# Patient Record
Sex: Female | Born: 1940
Health system: Southern US, Community
[De-identification: ages and names within clinical notes are randomized; demographics above are authoritative.]

## PROBLEM LIST (undated history)

## (undated) DIAGNOSIS — S065X9A Traumatic subdural hemorrhage with loss of consciousness of unspecified duration, initial encounter: Secondary | ICD-10-CM

## (undated) DIAGNOSIS — F410 Panic disorder [episodic paroxysmal anxiety] without agoraphobia: Secondary | ICD-10-CM

## (undated) DIAGNOSIS — J45909 Unspecified asthma, uncomplicated: Secondary | ICD-10-CM

## (undated) DIAGNOSIS — R112 Nausea with vomiting, unspecified: Secondary | ICD-10-CM

## (undated) DIAGNOSIS — K219 Gastro-esophageal reflux disease without esophagitis: Secondary | ICD-10-CM

## (undated) DIAGNOSIS — I809 Phlebitis and thrombophlebitis of unspecified site: Secondary | ICD-10-CM

## (undated) DIAGNOSIS — D649 Anemia, unspecified: Secondary | ICD-10-CM

## (undated) DIAGNOSIS — K649 Unspecified hemorrhoids: Secondary | ICD-10-CM

## (undated) DIAGNOSIS — N816 Rectocele: Secondary | ICD-10-CM

## (undated) DIAGNOSIS — K589 Irritable bowel syndrome without diarrhea: Secondary | ICD-10-CM

## (undated) DIAGNOSIS — Z9889 Other specified postprocedural states: Secondary | ICD-10-CM

## (undated) DIAGNOSIS — T4145XA Adverse effect of unspecified anesthetic, initial encounter: Secondary | ICD-10-CM

## (undated) DIAGNOSIS — T8859XA Other complications of anesthesia, initial encounter: Secondary | ICD-10-CM

## (undated) DIAGNOSIS — R51 Headache: Secondary | ICD-10-CM

## (undated) DIAGNOSIS — J189 Pneumonia, unspecified organism: Secondary | ICD-10-CM

## (undated) DIAGNOSIS — IMO0002 Reserved for concepts with insufficient information to code with codable children: Secondary | ICD-10-CM

## (undated) HISTORY — PX: SHOULDER SURGERY: SHX246

## (undated) HISTORY — PX: TONSILLECTOMY: SUR1361

## (undated) HISTORY — PX: CATARACT EXTRACTION: SUR2

## (undated) HISTORY — PX: ABDOMINAL HYSTERECTOMY: SHX81

---

## 1996-03-23 DIAGNOSIS — S065X9A Traumatic subdural hemorrhage with loss of consciousness of unspecified duration, initial encounter: Secondary | ICD-10-CM

## 1996-03-23 DIAGNOSIS — S065XAA Traumatic subdural hemorrhage with loss of consciousness status unknown, initial encounter: Secondary | ICD-10-CM

## 1996-03-23 HISTORY — DX: Traumatic subdural hemorrhage with loss of consciousness status unknown, initial encounter: S06.5XAA

## 1996-03-23 HISTORY — DX: Traumatic subdural hemorrhage with loss of consciousness of unspecified duration, initial encounter: S06.5X9A

## 2001-04-22 ENCOUNTER — Ambulatory Visit (HOSPITAL_COMMUNITY): Admission: RE | Admit: 2001-04-22 | Discharge: 2001-04-22 | Payer: Self-pay | Admitting: *Deleted

## 2001-11-23 ENCOUNTER — Other Ambulatory Visit: Admission: RE | Admit: 2001-11-23 | Discharge: 2001-11-23 | Payer: Self-pay | Admitting: Obstetrics & Gynecology

## 2003-04-11 ENCOUNTER — Other Ambulatory Visit: Admission: RE | Admit: 2003-04-11 | Discharge: 2003-04-11 | Payer: Self-pay | Admitting: Obstetrics & Gynecology

## 2004-04-23 ENCOUNTER — Other Ambulatory Visit: Admission: RE | Admit: 2004-04-23 | Discharge: 2004-04-23 | Payer: Self-pay | Admitting: Obstetrics & Gynecology

## 2005-05-19 ENCOUNTER — Other Ambulatory Visit: Admission: RE | Admit: 2005-05-19 | Discharge: 2005-05-19 | Payer: Self-pay | Admitting: Obstetrics & Gynecology

## 2008-09-09 ENCOUNTER — Emergency Department (HOSPITAL_BASED_OUTPATIENT_CLINIC_OR_DEPARTMENT_OTHER): Admission: EM | Admit: 2008-09-09 | Discharge: 2008-09-09 | Payer: Self-pay | Admitting: Emergency Medicine

## 2011-03-27 DIAGNOSIS — L820 Inflamed seborrheic keratosis: Secondary | ICD-10-CM | POA: Diagnosis not present

## 2011-03-27 DIAGNOSIS — L719 Rosacea, unspecified: Secondary | ICD-10-CM | POA: Diagnosis not present

## 2011-04-06 DIAGNOSIS — M999 Biomechanical lesion, unspecified: Secondary | ICD-10-CM | POA: Diagnosis not present

## 2011-04-06 DIAGNOSIS — M545 Low back pain: Secondary | ICD-10-CM | POA: Diagnosis not present

## 2011-04-08 DIAGNOSIS — Z0000 Encounter for general adult medical examination without abnormal findings: Secondary | ICD-10-CM | POA: Diagnosis not present

## 2011-04-08 DIAGNOSIS — G479 Sleep disorder, unspecified: Secondary | ICD-10-CM | POA: Diagnosis not present

## 2011-04-08 DIAGNOSIS — R079 Chest pain, unspecified: Secondary | ICD-10-CM | POA: Diagnosis not present

## 2011-04-08 DIAGNOSIS — Z8349 Family history of other endocrine, nutritional and metabolic diseases: Secondary | ICD-10-CM | POA: Diagnosis not present

## 2011-04-13 DIAGNOSIS — H02059 Trichiasis without entropian unspecified eye, unspecified eyelid: Secondary | ICD-10-CM | POA: Diagnosis not present

## 2011-04-13 DIAGNOSIS — H2510 Age-related nuclear cataract, unspecified eye: Secondary | ICD-10-CM | POA: Diagnosis not present

## 2011-04-13 DIAGNOSIS — H02409 Unspecified ptosis of unspecified eyelid: Secondary | ICD-10-CM | POA: Diagnosis not present

## 2011-04-21 DIAGNOSIS — G479 Sleep disorder, unspecified: Secondary | ICD-10-CM | POA: Diagnosis not present

## 2011-04-21 DIAGNOSIS — Z8349 Family history of other endocrine, nutritional and metabolic diseases: Secondary | ICD-10-CM | POA: Diagnosis not present

## 2011-04-21 DIAGNOSIS — M949 Disorder of cartilage, unspecified: Secondary | ICD-10-CM | POA: Diagnosis not present

## 2011-04-21 DIAGNOSIS — Z0000 Encounter for general adult medical examination without abnormal findings: Secondary | ICD-10-CM | POA: Diagnosis not present

## 2011-04-21 DIAGNOSIS — R079 Chest pain, unspecified: Secondary | ICD-10-CM | POA: Diagnosis not present

## 2011-04-22 DIAGNOSIS — H10429 Simple chronic conjunctivitis, unspecified eye: Secondary | ICD-10-CM | POA: Diagnosis not present

## 2011-04-23 DIAGNOSIS — M999 Biomechanical lesion, unspecified: Secondary | ICD-10-CM | POA: Diagnosis not present

## 2011-04-23 DIAGNOSIS — M545 Low back pain: Secondary | ICD-10-CM | POA: Diagnosis not present

## 2011-05-19 DIAGNOSIS — H2510 Age-related nuclear cataract, unspecified eye: Secondary | ICD-10-CM | POA: Diagnosis not present

## 2011-06-09 DIAGNOSIS — H2510 Age-related nuclear cataract, unspecified eye: Secondary | ICD-10-CM | POA: Diagnosis not present

## 2011-06-09 DIAGNOSIS — Z79899 Other long term (current) drug therapy: Secondary | ICD-10-CM | POA: Diagnosis not present

## 2011-06-24 DIAGNOSIS — M545 Low back pain: Secondary | ICD-10-CM | POA: Diagnosis not present

## 2011-06-24 DIAGNOSIS — M999 Biomechanical lesion, unspecified: Secondary | ICD-10-CM | POA: Diagnosis not present

## 2011-06-30 DIAGNOSIS — H2510 Age-related nuclear cataract, unspecified eye: Secondary | ICD-10-CM | POA: Diagnosis not present

## 2011-07-07 DIAGNOSIS — H2510 Age-related nuclear cataract, unspecified eye: Secondary | ICD-10-CM | POA: Diagnosis not present

## 2011-07-07 DIAGNOSIS — H259 Unspecified age-related cataract: Secondary | ICD-10-CM | POA: Diagnosis not present

## 2011-07-31 DIAGNOSIS — M999 Biomechanical lesion, unspecified: Secondary | ICD-10-CM | POA: Diagnosis not present

## 2011-07-31 DIAGNOSIS — M545 Low back pain: Secondary | ICD-10-CM | POA: Diagnosis not present

## 2011-08-07 DIAGNOSIS — M545 Low back pain: Secondary | ICD-10-CM | POA: Diagnosis not present

## 2011-08-07 DIAGNOSIS — M999 Biomechanical lesion, unspecified: Secondary | ICD-10-CM | POA: Diagnosis not present

## 2011-08-19 DIAGNOSIS — Z124 Encounter for screening for malignant neoplasm of cervix: Secondary | ICD-10-CM | POA: Diagnosis not present

## 2011-08-19 DIAGNOSIS — Z01419 Encounter for gynecological examination (general) (routine) without abnormal findings: Secondary | ICD-10-CM | POA: Diagnosis not present

## 2011-08-19 DIAGNOSIS — B373 Candidiasis of vulva and vagina: Secondary | ICD-10-CM | POA: Diagnosis not present

## 2011-08-24 DIAGNOSIS — M545 Low back pain: Secondary | ICD-10-CM | POA: Diagnosis not present

## 2011-08-24 DIAGNOSIS — M999 Biomechanical lesion, unspecified: Secondary | ICD-10-CM | POA: Diagnosis not present

## 2011-08-26 ENCOUNTER — Other Ambulatory Visit: Payer: Self-pay | Admitting: Obstetrics & Gynecology

## 2011-08-26 DIAGNOSIS — N6453 Retraction of nipple: Secondary | ICD-10-CM

## 2011-08-26 DIAGNOSIS — N644 Mastodynia: Secondary | ICD-10-CM

## 2011-08-28 DIAGNOSIS — M999 Biomechanical lesion, unspecified: Secondary | ICD-10-CM | POA: Diagnosis not present

## 2011-08-28 DIAGNOSIS — M545 Low back pain: Secondary | ICD-10-CM | POA: Diagnosis not present

## 2011-09-02 ENCOUNTER — Other Ambulatory Visit: Payer: Self-pay | Admitting: Obstetrics & Gynecology

## 2011-09-02 ENCOUNTER — Ambulatory Visit
Admission: RE | Admit: 2011-09-02 | Discharge: 2011-09-02 | Disposition: A | Discharge status: Home / Self Care | Payer: Medicare Other | Source: Ambulatory Visit | Attending: Obstetrics & Gynecology | Admitting: Obstetrics & Gynecology

## 2011-09-02 DIAGNOSIS — N6019 Diffuse cystic mastopathy of unspecified breast: Secondary | ICD-10-CM | POA: Diagnosis not present

## 2011-09-02 DIAGNOSIS — N6453 Retraction of nipple: Secondary | ICD-10-CM

## 2011-09-02 DIAGNOSIS — N644 Mastodynia: Secondary | ICD-10-CM | POA: Diagnosis not present

## 2011-09-17 DIAGNOSIS — M545 Low back pain: Secondary | ICD-10-CM | POA: Diagnosis not present

## 2011-09-17 DIAGNOSIS — M999 Biomechanical lesion, unspecified: Secondary | ICD-10-CM | POA: Diagnosis not present

## 2011-09-25 DIAGNOSIS — K591 Functional diarrhea: Secondary | ICD-10-CM | POA: Diagnosis not present

## 2011-09-25 DIAGNOSIS — K219 Gastro-esophageal reflux disease without esophagitis: Secondary | ICD-10-CM | POA: Diagnosis not present

## 2011-10-01 DIAGNOSIS — M999 Biomechanical lesion, unspecified: Secondary | ICD-10-CM | POA: Diagnosis not present

## 2011-10-01 DIAGNOSIS — M545 Low back pain: Secondary | ICD-10-CM | POA: Diagnosis not present

## 2011-10-05 DIAGNOSIS — J0100 Acute maxillary sinusitis, unspecified: Secondary | ICD-10-CM | POA: Diagnosis not present

## 2011-10-09 DIAGNOSIS — M999 Biomechanical lesion, unspecified: Secondary | ICD-10-CM | POA: Diagnosis not present

## 2011-10-09 DIAGNOSIS — M545 Low back pain: Secondary | ICD-10-CM | POA: Diagnosis not present

## 2011-10-12 DIAGNOSIS — K259 Gastric ulcer, unspecified as acute or chronic, without hemorrhage or perforation: Secondary | ICD-10-CM | POA: Diagnosis not present

## 2011-10-12 DIAGNOSIS — K625 Hemorrhage of anus and rectum: Secondary | ICD-10-CM | POA: Diagnosis not present

## 2011-10-12 DIAGNOSIS — K5900 Constipation, unspecified: Secondary | ICD-10-CM | POA: Diagnosis not present

## 2011-10-12 DIAGNOSIS — K2970 Gastritis, unspecified, without bleeding: Secondary | ICD-10-CM | POA: Diagnosis not present

## 2011-10-12 DIAGNOSIS — K648 Other hemorrhoids: Secondary | ICD-10-CM | POA: Diagnosis not present

## 2011-10-12 DIAGNOSIS — K219 Gastro-esophageal reflux disease without esophagitis: Secondary | ICD-10-CM | POA: Diagnosis not present

## 2011-10-12 DIAGNOSIS — K2940 Chronic atrophic gastritis without bleeding: Secondary | ICD-10-CM | POA: Diagnosis not present

## 2011-10-12 DIAGNOSIS — Z1211 Encounter for screening for malignant neoplasm of colon: Secondary | ICD-10-CM | POA: Diagnosis not present

## 2011-10-12 DIAGNOSIS — K3189 Other diseases of stomach and duodenum: Secondary | ICD-10-CM | POA: Diagnosis not present

## 2011-10-12 DIAGNOSIS — K2980 Duodenitis without bleeding: Secondary | ICD-10-CM | POA: Diagnosis not present

## 2011-10-16 DIAGNOSIS — M545 Low back pain: Secondary | ICD-10-CM | POA: Diagnosis not present

## 2011-10-16 DIAGNOSIS — M999 Biomechanical lesion, unspecified: Secondary | ICD-10-CM | POA: Diagnosis not present

## 2011-10-22 DIAGNOSIS — M545 Low back pain: Secondary | ICD-10-CM | POA: Diagnosis not present

## 2011-10-22 DIAGNOSIS — M999 Biomechanical lesion, unspecified: Secondary | ICD-10-CM | POA: Diagnosis not present

## 2011-10-26 DIAGNOSIS — M545 Low back pain: Secondary | ICD-10-CM | POA: Diagnosis not present

## 2011-10-28 DIAGNOSIS — B373 Candidiasis of vulva and vagina: Secondary | ICD-10-CM | POA: Diagnosis not present

## 2011-10-28 DIAGNOSIS — N390 Urinary tract infection, site not specified: Secondary | ICD-10-CM | POA: Diagnosis not present

## 2011-11-24 DIAGNOSIS — H10429 Simple chronic conjunctivitis, unspecified eye: Secondary | ICD-10-CM | POA: Diagnosis not present

## 2011-11-25 DIAGNOSIS — N390 Urinary tract infection, site not specified: Secondary | ICD-10-CM | POA: Diagnosis not present

## 2011-11-25 DIAGNOSIS — K623 Rectal prolapse: Secondary | ICD-10-CM | POA: Diagnosis not present

## 2011-11-25 DIAGNOSIS — K648 Other hemorrhoids: Secondary | ICD-10-CM | POA: Diagnosis not present

## 2011-11-25 DIAGNOSIS — K6289 Other specified diseases of anus and rectum: Secondary | ICD-10-CM | POA: Diagnosis not present

## 2011-11-25 DIAGNOSIS — K625 Hemorrhage of anus and rectum: Secondary | ICD-10-CM | POA: Diagnosis not present

## 2011-11-25 DIAGNOSIS — B373 Candidiasis of vulva and vagina: Secondary | ICD-10-CM | POA: Diagnosis not present

## 2011-11-30 DIAGNOSIS — M999 Biomechanical lesion, unspecified: Secondary | ICD-10-CM | POA: Diagnosis not present

## 2011-11-30 DIAGNOSIS — M545 Low back pain: Secondary | ICD-10-CM | POA: Diagnosis not present

## 2011-12-07 DIAGNOSIS — M999 Biomechanical lesion, unspecified: Secondary | ICD-10-CM | POA: Diagnosis not present

## 2011-12-07 DIAGNOSIS — M545 Low back pain: Secondary | ICD-10-CM | POA: Diagnosis not present

## 2011-12-29 DIAGNOSIS — M999 Biomechanical lesion, unspecified: Secondary | ICD-10-CM | POA: Diagnosis not present

## 2011-12-29 DIAGNOSIS — M545 Low back pain: Secondary | ICD-10-CM | POA: Diagnosis not present

## 2011-12-30 DIAGNOSIS — K648 Other hemorrhoids: Secondary | ICD-10-CM | POA: Diagnosis not present

## 2012-01-13 DIAGNOSIS — K648 Other hemorrhoids: Secondary | ICD-10-CM | POA: Diagnosis not present

## 2012-01-20 DIAGNOSIS — M545 Low back pain: Secondary | ICD-10-CM | POA: Diagnosis not present

## 2012-01-20 DIAGNOSIS — M999 Biomechanical lesion, unspecified: Secondary | ICD-10-CM | POA: Diagnosis not present

## 2012-02-15 DIAGNOSIS — M999 Biomechanical lesion, unspecified: Secondary | ICD-10-CM | POA: Diagnosis not present

## 2012-02-15 DIAGNOSIS — M545 Low back pain: Secondary | ICD-10-CM | POA: Diagnosis not present

## 2012-02-21 DIAGNOSIS — N760 Acute vaginitis: Secondary | ICD-10-CM | POA: Diagnosis not present

## 2012-04-01 DIAGNOSIS — M545 Low back pain: Secondary | ICD-10-CM | POA: Diagnosis not present

## 2012-04-01 DIAGNOSIS — M999 Biomechanical lesion, unspecified: Secondary | ICD-10-CM | POA: Diagnosis not present

## 2012-04-14 DIAGNOSIS — M545 Low back pain: Secondary | ICD-10-CM | POA: Diagnosis not present

## 2012-04-14 DIAGNOSIS — M999 Biomechanical lesion, unspecified: Secondary | ICD-10-CM | POA: Diagnosis not present

## 2012-05-08 DIAGNOSIS — T280XXA Burn of mouth and pharynx, initial encounter: Secondary | ICD-10-CM | POA: Diagnosis not present

## 2012-06-10 DIAGNOSIS — J069 Acute upper respiratory infection, unspecified: Secondary | ICD-10-CM | POA: Diagnosis not present

## 2012-06-10 DIAGNOSIS — Z23 Encounter for immunization: Secondary | ICD-10-CM | POA: Diagnosis not present

## 2012-06-10 DIAGNOSIS — M549 Dorsalgia, unspecified: Secondary | ICD-10-CM | POA: Diagnosis not present

## 2012-06-10 DIAGNOSIS — Z0000 Encounter for general adult medical examination without abnormal findings: Secondary | ICD-10-CM | POA: Diagnosis not present

## 2012-07-14 DIAGNOSIS — H04129 Dry eye syndrome of unspecified lacrimal gland: Secondary | ICD-10-CM | POA: Diagnosis not present

## 2012-07-14 DIAGNOSIS — H02409 Unspecified ptosis of unspecified eyelid: Secondary | ICD-10-CM | POA: Diagnosis not present

## 2012-07-14 DIAGNOSIS — Z9849 Cataract extraction status, unspecified eye: Secondary | ICD-10-CM | POA: Diagnosis not present

## 2012-07-14 DIAGNOSIS — Z961 Presence of intraocular lens: Secondary | ICD-10-CM | POA: Diagnosis not present

## 2012-07-14 DIAGNOSIS — Z9889 Other specified postprocedural states: Secondary | ICD-10-CM | POA: Diagnosis not present

## 2012-07-14 DIAGNOSIS — H02059 Trichiasis without entropian unspecified eye, unspecified eyelid: Secondary | ICD-10-CM | POA: Diagnosis not present

## 2012-07-21 DIAGNOSIS — M999 Biomechanical lesion, unspecified: Secondary | ICD-10-CM | POA: Diagnosis not present

## 2012-07-21 DIAGNOSIS — M545 Low back pain: Secondary | ICD-10-CM | POA: Diagnosis not present

## 2012-08-29 DIAGNOSIS — M810 Age-related osteoporosis without current pathological fracture: Secondary | ICD-10-CM | POA: Diagnosis not present

## 2012-08-29 DIAGNOSIS — Z13820 Encounter for screening for osteoporosis: Secondary | ICD-10-CM | POA: Diagnosis not present

## 2012-08-29 DIAGNOSIS — Z124 Encounter for screening for malignant neoplasm of cervix: Secondary | ICD-10-CM | POA: Diagnosis not present

## 2012-08-29 DIAGNOSIS — Z130 Encounter for screening for diseases of the blood and blood-forming organs and certain disorders involving the immune mechanism: Secondary | ICD-10-CM | POA: Diagnosis not present

## 2012-09-02 DIAGNOSIS — Z1231 Encounter for screening mammogram for malignant neoplasm of breast: Secondary | ICD-10-CM | POA: Diagnosis not present

## 2012-10-03 DIAGNOSIS — H524 Presbyopia: Secondary | ICD-10-CM | POA: Diagnosis not present

## 2012-10-03 DIAGNOSIS — H353190 Nonexudative age-related macular degeneration, unspecified eye, stage unspecified: Secondary | ICD-10-CM | POA: Diagnosis not present

## 2012-10-03 DIAGNOSIS — H2510 Age-related nuclear cataract, unspecified eye: Secondary | ICD-10-CM | POA: Diagnosis not present

## 2012-10-13 DIAGNOSIS — H35439 Paving stone degeneration of retina, unspecified eye: Secondary | ICD-10-CM | POA: Diagnosis not present

## 2012-10-13 DIAGNOSIS — H2640 Unspecified secondary cataract: Secondary | ICD-10-CM | POA: Diagnosis not present

## 2012-10-13 DIAGNOSIS — H43399 Other vitreous opacities, unspecified eye: Secondary | ICD-10-CM | POA: Diagnosis not present

## 2012-11-22 DIAGNOSIS — J309 Allergic rhinitis, unspecified: Secondary | ICD-10-CM | POA: Diagnosis not present

## 2012-11-22 DIAGNOSIS — H9209 Otalgia, unspecified ear: Secondary | ICD-10-CM | POA: Diagnosis not present

## 2012-11-22 DIAGNOSIS — H60509 Unspecified acute noninfective otitis externa, unspecified ear: Secondary | ICD-10-CM | POA: Diagnosis not present

## 2013-06-08 DIAGNOSIS — Z0000 Encounter for general adult medical examination without abnormal findings: Secondary | ICD-10-CM | POA: Diagnosis not present

## 2013-06-08 DIAGNOSIS — Z136 Encounter for screening for cardiovascular disorders: Secondary | ICD-10-CM | POA: Diagnosis not present

## 2013-06-08 DIAGNOSIS — F329 Major depressive disorder, single episode, unspecified: Secondary | ICD-10-CM | POA: Diagnosis not present

## 2013-06-08 DIAGNOSIS — M899 Disorder of bone, unspecified: Secondary | ICD-10-CM | POA: Diagnosis not present

## 2013-06-08 DIAGNOSIS — M949 Disorder of cartilage, unspecified: Secondary | ICD-10-CM | POA: Diagnosis not present

## 2013-06-08 DIAGNOSIS — F3289 Other specified depressive episodes: Secondary | ICD-10-CM | POA: Diagnosis not present

## 2013-06-20 DIAGNOSIS — M545 Low back pain, unspecified: Secondary | ICD-10-CM | POA: Diagnosis not present

## 2013-06-20 DIAGNOSIS — M999 Biomechanical lesion, unspecified: Secondary | ICD-10-CM | POA: Diagnosis not present

## 2013-07-05 DIAGNOSIS — Z23 Encounter for immunization: Secondary | ICD-10-CM | POA: Diagnosis not present

## 2013-07-05 DIAGNOSIS — Z0000 Encounter for general adult medical examination without abnormal findings: Secondary | ICD-10-CM | POA: Diagnosis not present

## 2013-07-05 DIAGNOSIS — Z1331 Encounter for screening for depression: Secondary | ICD-10-CM | POA: Diagnosis not present

## 2013-07-18 DIAGNOSIS — M999 Biomechanical lesion, unspecified: Secondary | ICD-10-CM | POA: Diagnosis not present

## 2013-07-18 DIAGNOSIS — M545 Low back pain, unspecified: Secondary | ICD-10-CM | POA: Diagnosis not present

## 2013-08-16 DIAGNOSIS — M999 Biomechanical lesion, unspecified: Secondary | ICD-10-CM | POA: Diagnosis not present

## 2013-08-16 DIAGNOSIS — M545 Low back pain, unspecified: Secondary | ICD-10-CM | POA: Diagnosis not present

## 2013-09-13 ENCOUNTER — Other Ambulatory Visit: Payer: Self-pay | Admitting: Obstetrics & Gynecology

## 2013-09-13 DIAGNOSIS — Z1212 Encounter for screening for malignant neoplasm of rectum: Secondary | ICD-10-CM | POA: Diagnosis not present

## 2013-09-13 DIAGNOSIS — Z01419 Encounter for gynecological examination (general) (routine) without abnormal findings: Secondary | ICD-10-CM | POA: Diagnosis not present

## 2013-09-13 DIAGNOSIS — N6320 Unspecified lump in the left breast, unspecified quadrant: Secondary | ICD-10-CM

## 2013-09-13 DIAGNOSIS — Z1231 Encounter for screening mammogram for malignant neoplasm of breast: Secondary | ICD-10-CM | POA: Diagnosis not present

## 2013-09-20 ENCOUNTER — Other Ambulatory Visit: Payer: Medicare Other

## 2013-09-21 ENCOUNTER — Ambulatory Visit
Admission: RE | Admit: 2013-09-21 | Discharge: 2013-09-21 | Disposition: A | Discharge status: Home / Self Care | Payer: Medicare Other | Source: Ambulatory Visit | Attending: Obstetrics & Gynecology | Admitting: Obstetrics & Gynecology

## 2013-09-21 ENCOUNTER — Other Ambulatory Visit: Payer: Self-pay | Admitting: Obstetrics & Gynecology

## 2013-09-21 DIAGNOSIS — N6320 Unspecified lump in the left breast, unspecified quadrant: Secondary | ICD-10-CM

## 2013-09-21 DIAGNOSIS — N6459 Other signs and symptoms in breast: Secondary | ICD-10-CM | POA: Diagnosis not present

## 2013-09-26 DIAGNOSIS — M545 Low back pain, unspecified: Secondary | ICD-10-CM | POA: Diagnosis not present

## 2013-09-26 DIAGNOSIS — M999 Biomechanical lesion, unspecified: Secondary | ICD-10-CM | POA: Diagnosis not present

## 2013-10-16 DIAGNOSIS — M545 Low back pain, unspecified: Secondary | ICD-10-CM | POA: Diagnosis not present

## 2013-10-16 DIAGNOSIS — N6009 Solitary cyst of unspecified breast: Secondary | ICD-10-CM | POA: Diagnosis not present

## 2013-10-16 DIAGNOSIS — M999 Biomechanical lesion, unspecified: Secondary | ICD-10-CM | POA: Diagnosis not present

## 2013-10-23 DIAGNOSIS — M545 Low back pain, unspecified: Secondary | ICD-10-CM | POA: Diagnosis not present

## 2013-10-23 DIAGNOSIS — M999 Biomechanical lesion, unspecified: Secondary | ICD-10-CM | POA: Diagnosis not present

## 2013-11-16 DIAGNOSIS — M545 Low back pain, unspecified: Secondary | ICD-10-CM | POA: Diagnosis not present

## 2013-11-16 DIAGNOSIS — M999 Biomechanical lesion, unspecified: Secondary | ICD-10-CM | POA: Diagnosis not present

## 2013-11-29 DIAGNOSIS — M545 Low back pain, unspecified: Secondary | ICD-10-CM | POA: Diagnosis not present

## 2013-11-29 DIAGNOSIS — M999 Biomechanical lesion, unspecified: Secondary | ICD-10-CM | POA: Diagnosis not present

## 2013-12-01 ENCOUNTER — Emergency Department (HOSPITAL_COMMUNITY): Payer: Medicare Other | Admitting: Anesthesiology

## 2013-12-01 ENCOUNTER — Encounter (HOSPITAL_COMMUNITY): Payer: Self-pay | Admitting: Emergency Medicine

## 2013-12-01 ENCOUNTER — Encounter (HOSPITAL_COMMUNITY): Payer: Medicare Other | Admitting: Anesthesiology

## 2013-12-01 ENCOUNTER — Observation Stay (HOSPITAL_COMMUNITY)
Admission: EM | Admit: 2013-12-01 | Discharge: 2013-12-02 | Disposition: A | Discharge status: Home / Self Care | Payer: Medicare Other | Attending: Orthopaedic Surgery | Admitting: Orthopaedic Surgery

## 2013-12-01 ENCOUNTER — Emergency Department (HOSPITAL_COMMUNITY): Payer: Medicare Other

## 2013-12-01 ENCOUNTER — Encounter (HOSPITAL_COMMUNITY)
Admission: EM | Disposition: A | Discharge status: Home / Self Care | Payer: Self-pay | Source: Home / Self Care | Attending: Emergency Medicine

## 2013-12-01 DIAGNOSIS — Z90710 Acquired absence of both cervix and uterus: Secondary | ICD-10-CM | POA: Insufficient documentation

## 2013-12-01 DIAGNOSIS — T148XXA Other injury of unspecified body region, initial encounter: Secondary | ICD-10-CM | POA: Diagnosis not present

## 2013-12-01 DIAGNOSIS — Z9849 Cataract extraction status, unspecified eye: Secondary | ICD-10-CM | POA: Insufficient documentation

## 2013-12-01 DIAGNOSIS — F410 Panic disorder [episodic paroxysmal anxiety] without agoraphobia: Secondary | ICD-10-CM | POA: Insufficient documentation

## 2013-12-01 DIAGNOSIS — M545 Low back pain, unspecified: Secondary | ICD-10-CM | POA: Diagnosis not present

## 2013-12-01 DIAGNOSIS — S9306XA Dislocation of unspecified ankle joint, initial encounter: Secondary | ICD-10-CM | POA: Diagnosis not present

## 2013-12-01 DIAGNOSIS — Y92009 Unspecified place in unspecified non-institutional (private) residence as the place of occurrence of the external cause: Secondary | ICD-10-CM | POA: Diagnosis not present

## 2013-12-01 DIAGNOSIS — Y998 Other external cause status: Secondary | ICD-10-CM | POA: Insufficient documentation

## 2013-12-01 DIAGNOSIS — S82853A Displaced trimalleolar fracture of unspecified lower leg, initial encounter for closed fracture: Principal | ICD-10-CM | POA: Insufficient documentation

## 2013-12-01 DIAGNOSIS — Y9389 Activity, other specified: Secondary | ICD-10-CM | POA: Diagnosis not present

## 2013-12-01 DIAGNOSIS — M25579 Pain in unspecified ankle and joints of unspecified foot: Secondary | ICD-10-CM | POA: Diagnosis not present

## 2013-12-01 DIAGNOSIS — W108XXA Fall (on) (from) other stairs and steps, initial encounter: Secondary | ICD-10-CM | POA: Diagnosis not present

## 2013-12-01 DIAGNOSIS — S82852A Displaced trimalleolar fracture of left lower leg, initial encounter for closed fracture: Secondary | ICD-10-CM | POA: Diagnosis present

## 2013-12-01 DIAGNOSIS — M999 Biomechanical lesion, unspecified: Secondary | ICD-10-CM | POA: Diagnosis not present

## 2013-12-01 DIAGNOSIS — Z87891 Personal history of nicotine dependence: Secondary | ICD-10-CM | POA: Insufficient documentation

## 2013-12-01 DIAGNOSIS — Z8673 Personal history of transient ischemic attack (TIA), and cerebral infarction without residual deficits: Secondary | ICD-10-CM | POA: Diagnosis not present

## 2013-12-01 DIAGNOSIS — S8263XA Displaced fracture of lateral malleolus of unspecified fibula, initial encounter for closed fracture: Secondary | ICD-10-CM | POA: Diagnosis not present

## 2013-12-01 DIAGNOSIS — S8253XA Displaced fracture of medial malleolus of unspecified tibia, initial encounter for closed fracture: Secondary | ICD-10-CM | POA: Diagnosis not present

## 2013-12-01 HISTORY — PX: EXTERNAL FIXATION LEG: SHX1549

## 2013-12-01 HISTORY — DX: Traumatic subdural hemorrhage with loss of consciousness of unspecified duration, initial encounter: S06.5X9A

## 2013-12-01 HISTORY — DX: Panic disorder (episodic paroxysmal anxiety): F41.0

## 2013-12-01 LAB — CBC WITH DIFFERENTIAL/PLATELET
Basophils Absolute: 0 10*3/uL (ref 0.0–0.1)
Basophils Relative: 0 % (ref 0–1)
Eosinophils Absolute: 0.1 10*3/uL (ref 0.0–0.7)
Eosinophils Relative: 2 % (ref 0–5)
HEMATOCRIT: 37.9 % (ref 36.0–46.0)
HEMOGLOBIN: 12.9 g/dL (ref 12.0–15.0)
LYMPHS ABS: 1.7 10*3/uL (ref 0.7–4.0)
LYMPHS PCT: 24 % (ref 12–46)
MCH: 30.5 pg (ref 26.0–34.0)
MCHC: 34 g/dL (ref 30.0–36.0)
MCV: 89.6 fL (ref 78.0–100.0)
MONO ABS: 0.4 10*3/uL (ref 0.1–1.0)
MONOS PCT: 6 % (ref 3–12)
NEUTROS ABS: 4.8 10*3/uL (ref 1.7–7.7)
Neutrophils Relative %: 68 % (ref 43–77)
Platelets: 200 10*3/uL (ref 150–400)
RBC: 4.23 MIL/uL (ref 3.87–5.11)
RDW: 13.8 % (ref 11.5–15.5)
WBC: 7 10*3/uL (ref 4.0–10.5)

## 2013-12-01 LAB — BASIC METABOLIC PANEL
ANION GAP: 14 (ref 5–15)
BUN: 15 mg/dL (ref 6–23)
CHLORIDE: 102 meq/L (ref 96–112)
CO2: 21 meq/L (ref 19–32)
Calcium: 9.2 mg/dL (ref 8.4–10.5)
Creatinine, Ser: 0.64 mg/dL (ref 0.50–1.10)
GFR calc Af Amer: >90 mL/min (ref >90)
GFR calc non Af Amer: 87 mL/min — ABNORMAL LOW (ref >90)
Glucose, Bld: 111 mg/dL — ABNORMAL HIGH (ref 70–99)
POTASSIUM: 4 meq/L (ref 3.7–5.3)
Sodium: 137 mEq/L (ref 137–147)

## 2013-12-01 SURGERY — EXTERNAL FIXATION, LOWER EXTREMITY
Anesthesia: General | Site: Leg Lower | Laterality: Left

## 2013-12-01 MED ORDER — METHOCARBAMOL 500 MG PO TABS
ORAL_TABLET | ORAL | Status: AC
  Filled 2013-12-01: qty 1

## 2013-12-01 MED ORDER — HYDROMORPHONE HCL PF 1 MG/ML IJ SOLN
0.2500 mg | INTRAMUSCULAR | Status: DC | PRN
  Administered 2013-12-01 – 2013-12-02 (×4): 0.5 mg via INTRAVENOUS

## 2013-12-01 MED ORDER — HYDROMORPHONE HCL PF 1 MG/ML IJ SOLN
INTRAMUSCULAR | Status: AC
  Filled 2013-12-01: qty 1

## 2013-12-01 MED ORDER — SUCCINYLCHOLINE CHLORIDE 20 MG/ML IJ SOLN
INTRAMUSCULAR | Status: AC
Start: 2013-12-01 — End: 2013-12-01
  Filled 2013-12-01: qty 1

## 2013-12-01 MED ORDER — ONDANSETRON HCL 4 MG/2ML IJ SOLN
INTRAMUSCULAR | Status: DC | PRN
Start: 2013-12-01 — End: 2013-12-01
  Administered 2013-12-01: 4 mg via INTRAVENOUS

## 2013-12-01 MED ORDER — ONDANSETRON HCL 4 MG/2ML IJ SOLN
INTRAMUSCULAR | Status: AC
  Filled 2013-12-01: qty 2

## 2013-12-01 MED ORDER — CEFAZOLIN SODIUM-DEXTROSE 2-3 GM-% IV SOLR
2.0000 g | Freq: Once | INTRAVENOUS | Status: AC
  Administered 2013-12-01: 2 g via INTRAVENOUS

## 2013-12-01 MED ORDER — FENTANYL CITRATE 0.05 MG/ML IJ SOLN
INTRAMUSCULAR | Status: AC
  Filled 2013-12-01: qty 5

## 2013-12-01 MED ORDER — LACTATED RINGERS IV SOLN
INTRAVENOUS | Status: DC | PRN
  Administered 2013-12-01: 22:00:00 via INTRAVENOUS

## 2013-12-01 MED ORDER — PROPOFOL 10 MG/ML IV BOLUS
INTRAVENOUS | Status: AC
Start: 2013-12-01 — End: 2013-12-01
  Filled 2013-12-01: qty 20

## 2013-12-01 MED ORDER — PROPOFOL 10 MG/ML IV BOLUS
INTRAVENOUS | Status: DC | PRN
  Administered 2013-12-01: 120 mg via INTRAVENOUS

## 2013-12-01 MED ORDER — FENTANYL CITRATE 0.05 MG/ML IJ SOLN
50.0000 ug | Freq: Once | INTRAMUSCULAR | Status: AC
  Administered 2013-12-01: 50 ug via INTRAVENOUS
  Filled 2013-12-01: qty 2

## 2013-12-01 MED ORDER — BUPIVACAINE HCL (PF) 0.25 % IJ SOLN
10.0000 mL | Freq: Once | INTRAMUSCULAR | Status: DC
  Filled 2013-12-01 (×2): qty 10

## 2013-12-01 MED ORDER — ONDANSETRON HCL 4 MG/2ML IJ SOLN: 4.0000 mg | Freq: Four times a day (QID) | INTRAMUSCULAR | Status: DC | PRN

## 2013-12-01 MED ORDER — 0.9 % SODIUM CHLORIDE (POUR BTL) OPTIME
TOPICAL | Status: DC | PRN
  Administered 2013-12-01: 1000 mL

## 2013-12-01 MED ORDER — METHOCARBAMOL 500 MG PO TABS
500.0000 mg | ORAL_TABLET | Freq: Four times a day (QID) | ORAL | Status: DC | PRN
Start: 2013-12-01 — End: 2013-12-01

## 2013-12-01 MED ORDER — BUPIVACAINE HCL (PF) 0.5 % IJ SOLN
10.0000 mL | Freq: Once | INTRAMUSCULAR | Status: AC
  Administered 2013-12-01: 10 mL
  Filled 2013-12-01: qty 10

## 2013-12-01 MED ORDER — ARTIFICIAL TEARS OP OINT
TOPICAL_OINTMENT | OPHTHALMIC | Status: AC
  Filled 2013-12-01: qty 3.5

## 2013-12-01 MED ORDER — CEFAZOLIN SODIUM-DEXTROSE 2-3 GM-% IV SOLR
INTRAVENOUS | Status: AC
  Filled 2013-12-01: qty 50

## 2013-12-01 MED ORDER — METHOCARBAMOL 500 MG PO TABS
500.0000 mg | ORAL_TABLET | Freq: Four times a day (QID) | ORAL | Status: DC | PRN
  Administered 2013-12-01: 500 mg via ORAL
  Filled 2013-12-01: qty 1

## 2013-12-01 MED ORDER — PHENYLEPHRINE 40 MCG/ML (10ML) SYRINGE FOR IV PUSH (FOR BLOOD PRESSURE SUPPORT)
PREFILLED_SYRINGE | INTRAVENOUS | Status: AC
  Filled 2013-12-01: qty 10

## 2013-12-01 MED ORDER — METHOCARBAMOL 1000 MG/10ML IJ SOLN: 500.0000 mg | Freq: Four times a day (QID) | INTRAMUSCULAR | Status: DC | PRN

## 2013-12-01 MED ORDER — LIDOCAINE HCL (PF) 1 % IJ SOLN
20.0000 mL | Freq: Once | INTRAMUSCULAR | Status: AC
  Administered 2013-12-01: 20 mL

## 2013-12-01 MED ORDER — LIDOCAINE HCL (CARDIAC) 20 MG/ML IV SOLN
INTRAVENOUS | Status: DC | PRN
  Administered 2013-12-01: 50 mg via INTRAVENOUS

## 2013-12-01 MED ORDER — FENTANYL CITRATE 0.05 MG/ML IJ SOLN
INTRAMUSCULAR | Status: DC | PRN
  Administered 2013-12-01 (×2): 25 ug via INTRAVENOUS
  Administered 2013-12-01: 50 ug via INTRAVENOUS
  Administered 2013-12-01 (×2): 25 ug via INTRAVENOUS
  Administered 2013-12-01: 50 ug via INTRAVENOUS

## 2013-12-01 MED ORDER — ARTIFICIAL TEARS OP OINT
TOPICAL_OINTMENT | OPHTHALMIC | Status: DC | PRN
  Administered 2013-12-01: 1 via OPHTHALMIC

## 2013-12-01 MED ORDER — LIDOCAINE HCL (CARDIAC) 20 MG/ML IV SOLN
INTRAVENOUS | Status: AC
  Filled 2013-12-01: qty 5

## 2013-12-01 SURGICAL SUPPLY — 51 items
BANDAGE ELASTIC 4 VELCRO ST LF (GAUZE/BANDAGES/DRESSINGS) ×2 IMPLANT
BAR EXFIX SM BONE 10.5X400 (Trauma) ×4 IMPLANT
BIT DRILL 3.5 SHOFT HALF PIN (BIT) ×2 IMPLANT
BNDG GAUZE ELAST 4 BULKY (GAUZE/BANDAGES/DRESSINGS) ×2 IMPLANT
CLAMP BAR TO RING (Clamp) ×4 IMPLANT
CLAMP PIN TO BAR (Clamp) ×4 IMPLANT
COVER SURGICAL LIGHT HANDLE (MISCELLANEOUS) ×2 IMPLANT
DRAPE C-ARM 42X72 X-RAY (DRAPES) ×2 IMPLANT
DRAPE C-ARMOR (DRAPES) ×2 IMPLANT
DRAPE ORTHO SPLIT 77X108 STRL (DRAPES) ×2
DRAPE SURG ORHT 6 SPLT 77X108 (DRAPES) ×2 IMPLANT
DRAPE U-SHAPE 47X51 STRL (DRAPES) ×2 IMPLANT
DURAPREP 26ML APPLICATOR (WOUND CARE) ×2 IMPLANT
ELECT REM PT RETURN 9FT ADLT (ELECTROSURGICAL)
ELECTRODE REM PT RTRN 9FT ADLT (ELECTROSURGICAL) IMPLANT
GAUZE SPONGE 4X4 12PLY STRL (GAUZE/BANDAGES/DRESSINGS) ×2 IMPLANT
GAUZE XEROFORM 5X9 LF (GAUZE/BANDAGES/DRESSINGS) ×2 IMPLANT
GLOVE BIO SURGEON STRL SZ8 (GLOVE) ×2 IMPLANT
GLOVE BIOGEL PI IND STRL 6.5 (GLOVE) ×2 IMPLANT
GLOVE BIOGEL PI IND STRL 7.0 (GLOVE) ×1 IMPLANT
GLOVE BIOGEL PI IND STRL 7.5 (GLOVE) ×1 IMPLANT
GLOVE BIOGEL PI IND STRL 8 (GLOVE) ×1 IMPLANT
GLOVE BIOGEL PI INDICATOR 6.5 (GLOVE) ×2
GLOVE BIOGEL PI INDICATOR 7.0 (GLOVE) ×1
GLOVE BIOGEL PI INDICATOR 7.5 (GLOVE) ×1
GLOVE BIOGEL PI INDICATOR 8 (GLOVE) ×1
GLOVE ORTHO TXT STRL SZ7.5 (GLOVE) ×2 IMPLANT
GLOVE SURG SS PI 7.5 STRL IVOR (GLOVE) ×2 IMPLANT
GOWN STRL REUS W/ TWL LRG LVL3 (GOWN DISPOSABLE) ×1 IMPLANT
GOWN STRL REUS W/ TWL XL LVL3 (GOWN DISPOSABLE) ×4 IMPLANT
GOWN STRL REUS W/TWL LRG LVL3 (GOWN DISPOSABLE) ×1
GOWN STRL REUS W/TWL XL LVL3 (GOWN DISPOSABLE) ×4
KIT BASIN OR (CUSTOM PROCEDURE TRAY) ×2 IMPLANT
KIT ROOM TURNOVER OR (KITS) ×2 IMPLANT
MANIFOLD NEPTUNE II (INSTRUMENTS) IMPLANT
NS IRRIG 1000ML POUR BTL (IV SOLUTION) ×2 IMPLANT
PACK ORTHO EXTREMITY (CUSTOM PROCEDURE TRAY) ×2 IMPLANT
PAD ARMBOARD 7.5X6 YLW CONV (MISCELLANEOUS) ×4 IMPLANT
PADDING CAST COTTON 6X4 STRL (CAST SUPPLIES) ×2 IMPLANT
PIN CLAMP MULTI (EXFIX) ×2 IMPLANT
PIN HALF 5X40 (EXFIX) ×4 IMPLANT
PIN TRACTION 5X50 (EXFIX) ×2 IMPLANT
SPONGE GAUZE 4X4 12PLY STER LF (GAUZE/BANDAGES/DRESSINGS) ×2 IMPLANT
SPONGE LAP 18X18 X RAY DECT (DISPOSABLE) ×2 IMPLANT
SUT ETHILON 2 0 FS 18 (SUTURE) IMPLANT
SUT VIC AB 2-0 CT1 27 (SUTURE)
SUT VIC AB 2-0 CT1 TAPERPNT 27 (SUTURE) IMPLANT
TOWEL OR 17X24 6PK STRL BLUE (TOWEL DISPOSABLE) ×2 IMPLANT
TOWEL OR 17X26 10 PK STRL BLUE (TOWEL DISPOSABLE) ×2 IMPLANT
UNDERPAD 30X30 INCONTINENT (UNDERPADS AND DIAPERS) ×2 IMPLANT
WATER STERILE IRR 1000ML POUR (IV SOLUTION) ×2 IMPLANT

## 2013-12-01 NOTE — Brief Op Note (Signed)
12/01/2013  11:15 PM  PATIENT:  Madison Wall  73 y.o. female  PRE-OPERATIVE DIAGNOSIS:  Left ankle fracture, dislocation  POST-OPERATIVE DIAGNOSIS:  Left ankle dislocation  PROCEDURE:  Procedure(s): EXTERNAL FIXATION LEG (Left)  SURGEON:  Surgeon(s) and Role:    * Mcarthur Rossetti, MD - Primary  PHYSICIAN ASSISTANT: Benita Stabile, PA-C  ANESTHESIA:   general  EBL:  Total I/O In: -  Out: 100 [Urine:100]  BLOOD ADMINISTERED:none  DRAINS: none   LOCAL MEDICATIONS USED:  NONE  SPECIMEN:  No Specimen  DISPOSITION OF SPECIMEN:  N/A  COUNTS:  YES  TOURNIQUET:  * No tourniquets in log *  DICTATION: .Other Dictation: Dictation Number [id number]  PLAN OF CARE: Admit for overnight observation  PATIENT DISPOSITION:  PACU - hemodynamically stable.   Delay start of Pharmacological VTE agent (>24hrs) due to surgical blood loss or risk of bleeding: no

## 2013-12-01 NOTE — Anesthesia Preprocedure Evaluation (Addendum)
Anesthesia Evaluation  Patient identified by MRN, date of birth, ID band Patient awake    Reviewed: Allergy & Precautions, H&P , NPO status , Patient's Chart, lab work & pertinent test results  History of Anesthesia Complications (+) PONV  Airway Mallampati: II TM Distance: >3 FB Neck ROM: Full    Dental  (+) Teeth Intact, Dental Advisory Given, Implants   Pulmonary former smoker,          Cardiovascular negative cardio ROS      Neuro/Psych Anxiety    GI/Hepatic   Endo/Other    Renal/GU      Musculoskeletal   Abdominal   Peds  Hematology   Anesthesia Other Findings   Reproductive/Obstetrics                         Anesthesia Physical Anesthesia Plan  ASA: III and emergent  Anesthesia Plan: General   Post-op Pain Management:    Induction: Intravenous, Rapid sequence and Cricoid pressure planned  Airway Management Planned: Oral ETT  Additional Equipment:   Intra-op Plan:   Post-operative Plan: Extubation in OR  Informed Consent: I have reviewed the patients History and Physical, chart, labs and discussed the procedure including the risks, benefits and alternatives for the proposed anesthesia with the patient or authorized representative who has indicated his/her understanding and acceptance.   Dental advisory given  Plan Discussed with: CRNA, Anesthesiologist and Surgeon  Anesthesia Plan Comments:        Anesthesia Quick Evaluation

## 2013-12-01 NOTE — ED Notes (Signed)
Ortho tech paged per Dr. Ninfa Linden

## 2013-12-01 NOTE — ED Notes (Addendum)
Pt arrives via EMS post fall, missed a step in living room. No LOC. Obvious deformity to L foot. Pulses and CMS intact. 250 MCG fentanyl, 4 MG zofran on board. 20G IV in L hand. 162/86, HR 80, SpO2 100% on RA.
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
## 2013-12-01 NOTE — Progress Notes (Signed)
Orthopedic Tech Progress Note Patient Details:  Madison Wall 1940/04/05 [id number] Ordered by Dr. Alexis Goodell Devices Type of Ortho Device: Ace wrap;Post (short leg) splint;Stirrup splint Ortho Device/Splint Location: LLE Ortho Device/Splint Interventions: Ordered;Application   Braulio Bosch 12/01/2013, 9:36 PM

## 2013-12-01 NOTE — Transfer of Care (Signed)
Immediate Anesthesia Transfer of Care Note  Patient: Madison Wall  Procedure(s) Performed: Procedure(s): EXTERNAL FIXATION LEG (Left)  Patient Location: PACU  Anesthesia Type:General  Level of Consciousness: awake, alert  and oriented  Airway & Oxygen Therapy: Patient Spontanous Breathing and Patient connected to nasal cannula oxygen  Post-op Assessment: Report given to PACU RN, Post -op Vital signs reviewed and stable and Patient moving all extremities  Post vital signs: Reviewed and stable  Complications: No apparent anesthesia complications
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
## 2013-12-01 NOTE — Anesthesia Procedure Notes (Signed)
Procedure Name: Intubation Date/Time: 12/01/2013 10:29 PM Performed by: Vaughan Browner Pre-anesthesia Checklist: Patient identified, Emergency Drugs available, Suction available and Patient being monitored Patient Re-evaluated:Patient Re-evaluated prior to inductionOxygen Delivery Method: Circle system utilized Preoxygenation: Pre-oxygenation with 100% oxygen Intubation Type: IV induction, Rapid sequence and Cricoid Pressure applied Laryngoscope Size: Mac and 3 Grade View: Grade I Tube type: Oral Tube size: 7.0 mm Number of attempts: 1 Airway Equipment and Method: Stylet Placement Confirmation: ETT inserted through vocal cords under direct vision,  positive ETCO2 and breath sounds checked- equal and bilateral Secured at: 21 cm Tube secured with: Tape Dental Injury: Teeth and Oropharynx as per pre-operative assessment

## 2013-12-02 DIAGNOSIS — S82853A Displaced trimalleolar fracture of unspecified lower leg, initial encounter for closed fracture: Secondary | ICD-10-CM | POA: Diagnosis not present

## 2013-12-02 LAB — BASIC METABOLIC PANEL
Anion gap: 10 (ref 5–15)
BUN: 10 mg/dL (ref 6–23)
CHLORIDE: 104 meq/L (ref 96–112)
CO2: 25 mEq/L (ref 19–32)
Calcium: 8.7 mg/dL (ref 8.4–10.5)
Creatinine, Ser: 0.51 mg/dL (ref 0.50–1.10)
GFR calc Af Amer: >90 mL/min (ref >90)
GFR calc non Af Amer: >90 mL/min (ref >90)
GLUCOSE: 134 mg/dL — AB (ref 70–99)
POTASSIUM: 4.5 meq/L (ref 3.7–5.3)
Sodium: 139 mEq/L (ref 137–147)

## 2013-12-02 MED ORDER — ONDANSETRON HCL 4 MG/2ML IJ SOLN
4.0000 mg | Freq: Four times a day (QID) | INTRAMUSCULAR | Status: DC | PRN
  Administered 2013-12-02: 4 mg via INTRAVENOUS
  Filled 2013-12-02: qty 2

## 2013-12-02 MED ORDER — ACETAMINOPHEN-CODEINE #3 300-30 MG PO TABS: 1.0000 | ORAL_TABLET | ORAL | Status: DC | PRN

## 2013-12-02 MED ORDER — MORPHINE SULFATE 2 MG/ML IJ SOLN: 1.0000 mg | INTRAMUSCULAR | Status: DC | PRN

## 2013-12-02 MED ORDER — METOCLOPRAMIDE HCL 5 MG/ML IJ SOLN
5.0000 mg | Freq: Three times a day (TID) | INTRAMUSCULAR | Status: DC | PRN
  Administered 2013-12-02: 5 mg via INTRAVENOUS
  Filled 2013-12-02: qty 2

## 2013-12-02 MED ORDER — ONDANSETRON HCL 4 MG PO TABS: 4.0000 mg | ORAL_TABLET | Freq: Four times a day (QID) | ORAL | Status: DC | PRN

## 2013-12-02 MED ORDER — IBUPROFEN 800 MG PO TABS
800.0000 mg | ORAL_TABLET | Freq: Four times a day (QID) | ORAL | Status: DC | PRN
  Administered 2013-12-02: 800 mg via ORAL
  Filled 2013-12-02 (×2): qty 1

## 2013-12-02 MED ORDER — ENOXAPARIN SODIUM 40 MG/0.4ML ~~LOC~~ SOLN: 40.0000 mg | SUBCUTANEOUS | Status: DC

## 2013-12-02 MED ORDER — CEFAZOLIN SODIUM 1-5 GM-% IV SOLN
1.0000 g | Freq: Four times a day (QID) | INTRAVENOUS | Status: DC
  Administered 2013-12-02 (×2): 1 g via INTRAVENOUS
  Filled 2013-12-02 (×4): qty 50

## 2013-12-02 MED ORDER — ACETAMINOPHEN-CODEINE #3 300-30 MG PO TABS
1.0000 | ORAL_TABLET | ORAL | Status: DC | PRN
  Administered 2013-12-02: 1 via ORAL
  Filled 2013-12-02: qty 1

## 2013-12-02 MED ORDER — ENOXAPARIN SODIUM 40 MG/0.4ML ~~LOC~~ SOLN
40.0000 mg | SUBCUTANEOUS | Status: DC
  Administered 2013-12-02: 40 mg via SUBCUTANEOUS
  Filled 2013-12-02: qty 0.4

## 2013-12-02 MED ORDER — CYCLOBENZAPRINE HCL 10 MG PO TABS: 10.0000 mg | ORAL_TABLET | Freq: Three times a day (TID) | ORAL | Status: DC | PRN

## 2013-12-02 MED ORDER — TRAMADOL HCL 50 MG PO TABS
100.0000 mg | ORAL_TABLET | Freq: Four times a day (QID) | ORAL | Status: DC | PRN
  Administered 2013-12-02: 100 mg via ORAL
  Filled 2013-12-02: qty 2

## 2013-12-02 MED ORDER — HYDROMORPHONE HCL PF 1 MG/ML IJ SOLN
0.5000 mg | INTRAMUSCULAR | Status: DC | PRN
  Administered 2013-12-02: 0.5 mg via INTRAVENOUS
  Filled 2013-12-02: qty 1

## 2013-12-02 MED ORDER — DIPHENHYDRAMINE HCL 12.5 MG/5ML PO ELIX: 12.5000 mg | ORAL_SOLUTION | ORAL | Status: DC | PRN

## 2013-12-02 MED ORDER — ALPRAZOLAM 0.25 MG PO TABS
0.2500 mg | ORAL_TABLET | Freq: Every evening | ORAL | Status: DC | PRN
Start: 2013-12-02 — End: 2013-12-02
  Administered 2013-12-02: 0.25 mg via ORAL
  Filled 2013-12-02: qty 1

## 2013-12-02 MED ORDER — DOCUSATE SODIUM 100 MG PO CAPS
100.0000 mg | ORAL_CAPSULE | Freq: Every day | ORAL | Status: DC | PRN
  Administered 2013-12-02 (×2): 100 mg via ORAL
  Filled 2013-12-02 (×2): qty 1

## 2013-12-02 MED ORDER — METOCLOPRAMIDE HCL 10 MG PO TABS: 5.0000 mg | ORAL_TABLET | Freq: Three times a day (TID) | ORAL | Status: DC | PRN

## 2013-12-02 MED ORDER — SODIUM CHLORIDE 0.9 % IV SOLN
INTRAVENOUS | Status: DC
  Administered 2013-12-02: via INTRAVENOUS

## 2013-12-02 MED ORDER — CALCIUM CARBONATE ANTACID 500 MG PO CHEW
1.0000 | CHEWABLE_TABLET | Freq: Every day | ORAL | Status: DC | PRN
  Administered 2013-12-02: 200 mg via ORAL
  Filled 2013-12-02 (×2): qty 1

## 2013-12-02 MED ORDER — CYCLOSPORINE 0.05 % OP EMUL
1.0000 [drp] | Freq: Two times a day (BID) | OPHTHALMIC | Status: DC
  Administered 2013-12-02: 1 [drp] via OPHTHALMIC
  Filled 2013-12-02 (×3): qty 1

## 2013-12-02 NOTE — Evaluation (Signed)
Physical Therapy Evaluation Patient Details Name: Madison Wall MRN: [id number] DOB: Oct 15, 1940 Today's Date: 12/02/2013   History of Present Illness  73 y.o. female s/p External fixation spanning left ankle joint. Hx of subdural hematoma.  Clinical Impression  Patient is seen following the above procedure and presents with functional limitations due to the deficits listed below (see PT Problem List). Limited ambulatory distance secondary to fatigue from weight of external fixator and Hx of shoulder surgeries. Safely controls walker with short distance mobility and completed stair training this AM. Pt husband present and actively participated in therapy session. He will be available 24 hours upon d/c to care patient at home. Feel she is adequate for d/c from a mobility standpoint. Patient will continue to benefit from skilled PT at home with HHPT to increase their independence and safety with mobility to allow discharge to the venue listed below.       Follow Up Recommendations Home health PT;Supervision for mobility/OOB    Equipment Recommendations  Rolling walker with 5" wheels;3in1 (PT);Wheelchair (measurements PT);Wheelchair cushion (measurements PT) (youth walker. w/c with elevating leg rests)    Recommendations for Other Services OT consult     Precautions / Restrictions Precautions Precautions: Fall Precaution Comments: external fixator Restrictions Weight Bearing Restrictions: Yes LLE Weight Bearing: Non weight bearing      Mobility  Bed Mobility Overal bed mobility: Needs Assistance Bed Mobility: Supine to Sit     Supine to sit: Min assist     General bed mobility comments: Min assist for LLE support out of bed. Educated husband on safe handling technique. VC for pt technique.  Transfers Overall transfer level: Needs assistance Equipment used: Rolling walker (2 wheeled) Transfers: Sit to/from Stand Sit to Stand: Supervision         General transfer  comment: Supervision for safety. VC for hand placement. Able to safely maintain NWB on LLE.  Ambulation/Gait Ambulation/Gait assistance: Min guard Ambulation Distance (Feet): 10 Feet (x2) Assistive device: Rolling walker (2 wheeled) Gait Pattern/deviations:  ("hop-to pattern")   Gait velocity interpretation: Below normal speed for age/gender General Gait Details: Educated on safe DME use. Pt distance limited by fatigue due to weight of external fixator and hx of shoulder surgeries. Overal demonstrates good control of RW and stability. Instructed for slower turns for safety.  Stairs Stairs: Yes Stairs assistance: Min assist Stair Management: No rails;Step to pattern;Backwards;With walker Number of Stairs: 2 General stair comments: Min assist to block RW. Husband present and actively participated in stair training. Pt able to verbalize understanding of safe sequencing. States she feels confident with this task.  Wheelchair Mobility    Modified Rankin (Stroke Patients Only)       Balance Overall balance assessment: Needs assistance Sitting-balance support: No upper extremity supported;Feet supported Sitting balance-Leahy Scale: Good     Standing balance support: No upper extremity supported Standing balance-Leahy Scale: Fair                               Pertinent Vitals/Pain Pain Assessment: 0-10 Pain Score: 2  Pain Location: Lt ankle Pain Intervention(s): Limited activity within patient's tolerance;Monitored during session;Repositioned    Home Living Family/patient expects to be discharged to:: Private residence Living Arrangements: Spouse/significant other   Type of Home: House Home Access: Stairs to enter Entrance Stairs-Rails: Psychiatric nurse of Steps: 3 Home Layout: Two level;Able to live on main level with bedroom/bathroom Home Equipment: None  Prior Function Level of Independence: Independent               Hand  Dominance   Dominant Hand: Right    Extremity/Trunk Assessment   Upper Extremity Assessment: Defer to OT evaluation           Lower Extremity Assessment: LLE deficits/detail   LLE Deficits / Details: Decreased strength and ROM as expected post op. able to move toes, normal sensation at toes. limited assessment due to external fixator     Communication   Communication: No difficulties  Cognition Arousal/Alertness: Awake/alert Behavior During Therapy: WFL for tasks assessed/performed Overall Cognitive Status: Within Functional Limits for tasks assessed                      General Comments      Exercises Other Exercises Other Exercises: Educated on knee presses, glute squeezes, and toe mobility.      Assessment/Plan    PT Assessment Patient needs continued PT services  PT Diagnosis Difficulty walking;Abnormality of gait;Acute pain   PT Problem List Decreased strength;Decreased range of motion;Decreased activity tolerance;Decreased balance;Decreased mobility;Decreased knowledge of use of DME;Pain  PT Treatment Interventions DME instruction;Gait training;Functional mobility training;Therapeutic activities;Stair training;Therapeutic exercise;Balance training;Neuromuscular re-education;Patient/family education;Modalities;Wheelchair mobility training   PT Goals (Current goals can be found in the Care Plan section) Acute Rehab PT Goals Patient Stated Goal: Go home PT Goal Formulation: With patient Time For Goal Achievement: 12/09/13 Potential to Achieve Goals: Good    Frequency Min 5X/week   Barriers to discharge        Co-evaluation               End of Session   Activity Tolerance: Patient tolerated treatment well Patient left: in chair;with call bell/phone within reach;with family/visitor present Nurse Communication: Mobility status    Functional Assessment Tool Used: clinical observation Functional Limitation: Mobility: Walking and moving  around Mobility: Walking and Moving Around Current Status (W1093): At least 20 percent but less than 40 percent impaired, limited or restricted Mobility: Walking and Moving Around Goal Status [phone]): At least 1 percent but less than 20 percent impaired, limited or restricted    Time: 1017-1048 PT Time Calculation (min): 31 min   Charges:   PT Evaluation $Initial PT Evaluation Tier I: 1 Procedure PT Treatments $Gait Training: 8-22 mins   PT G Codes:   Functional Assessment Tool Used: clinical observation Functional Limitation: Mobility: Walking and moving around   [place], [place]  Ellouise Newer 12/02/2013, 11:54 AM

## 2013-12-02 NOTE — Progress Notes (Signed)
Subjective: 1 Day Post-Op Procedure(s) (LRB): EXTERNAL FIXATION LEG (Left) Patient reports pain as mild to moderate.  Reports pain med dilaudid to strong and tramadol might not be enough. Asking for Tylenol #3. Otherwise doing well.   Objective: Vital signs in last 24 hours: Temp:  [97.2 F (36.2 C)-97.9 F (36.6 C)] 97.4 F (36.3 C) (09/12 0600) Pulse Rate:  [63-97] 73 (09/12 0600) Resp:  [10-17] 14 (09/12 0600) BP: (125-161)/(66-93) 125/68 mmHg (09/12 0600) SpO2:  [100 %] 100 % (09/12 0600) Weight:  [59.421 kg (131 lb)] 59.421 kg (131 lb) (09/11 2009)  Intake/Output from previous day: 09/11 0701 - 09/12 0700 In: 760 [I.V.:760] Out: 476 [Urine:451; Blood:25] Intake/Output this shift:     Recent Labs  12/01/13 2026  HGB 12.9    Recent Labs  12/01/13 2026  WBC 7.0  RBC 4.23  HCT 37.9  PLT 200    Recent Labs  12/01/13 2026 12/02/13 0711  NA 137 139  K 4.0 4.5  CL 102 104  CO2 21 25  BUN 15 10  CREATININE 0.64 0.51  GLUCOSE 111* 134*  CALCIUM 9.2 8.7   No results found for this basename: LABPT, INR,  in the last 72 hours  Sensation intact distally Intact pulses distally Incision: dressing C/D/I Compartment soft Wiggles toes without pain  Assessment/Plan: 1 Day Post-Op Procedure(s) (LRB): EXTERNAL FIXATION LEG (Left) Up with therapy ORIF left ankle next week.  Possible discharge later today if does will with PT. Non weight bearing left leg.   Erskine Emery 12/02/2013, 9:17 AM

## 2013-12-02 NOTE — Op Note (Signed)
NAMEJODYE, Madison Wall NO.:  [id number]  MEDICAL RECORD NO.:  [id number]  LOCATION:  [id number]                        FACILITY:  [facility]  PHYSICIAN:  Lind Guest. Ninfa Linden, M.D.DATE OF BIRTH:  1940/07/14  DATE OF PROCEDURE:  12/01/2013 DATE OF DISCHARGE:                              OPERATIVE REPORT   PREOPERATIVE DIAGNOSIS:  Unstable left trimalleolar ankle fracture dislocation.  POSTOPERATIVE DIAGNOSIS:  Unstable left trimalleolar ankle fracture dislocation.  PROCEDURE:  External fixation spanning left ankle joint.  IMPLANTS:  Smith and Nephew external fixation spanning left ankle joint.  SURGEON:  Jean Rosenthal, MD.  ASSISTING:  Erskine Emery, PA-C.  ANESTHESIA:  General.  BLOOD LOSS:  Minimal.  COMPLICATIONS:  None.  ANTIBIOTICS:  2 g IV Ancef.  INDICATIONS:  [name] is a very pleasant 73 year old female who missed 1 step and tripped going down some steps earlier this evening. She had an obvious deformity in her left ankle.  EMS was called, and she was taken to the [facility].  She was found to have an unstable left ankle fracture dislocation.  I tried to reduce this in the ER but it was still unstable, kept sliding out the back.  I recommended she undergo a temporary external fixation to stabilize the fracture allowing the soft tissue swelling to subside.  I talked to her and her husband about this in detail and they understand the need for surgery and understood this proceeding with external fixation.  PROCEDURE IN DETAIL:  After informed consent was obtained, appropriate left ankle was marked.  She was brought to the operating room, placed supine on the operating table.  General anesthesia was then obtained. Her left leg was prepped and draped with ChloraPrep and sterile drapes. A time-out was called identifying the correct patient, correct left ankle.  We then made 2 small incisions in the tibia anteriorly and placed two  Steinmann pins from anterior to posterior under direct visualization and direct fluoroscopy.  We then made a small incision on the medial aspect of the calcaneus and put a calcaneal pin from medial to lateral.  We then constructed our delta frame and held the ankle in a reduced position at the tibiotalar joint.  We then placed well-padded sterile dressings around the external fixator pin sites.  She was awakened, extubated, and taken to the recovery room in stable condition. All final counts were correct.  There were no complications noted. Postoperatively, she will be admitted for overnight observation with physical therapy and nonweightbearing on the left ankle.  Definitive treatment will be set up as an outpatient on a later date.  Of note, Erskine Emery, PA-C assisted during the entire case and his assistance was crucial for holding the ankle in a reduced position and traction while the external fixation was applied.     Lind Guest. Ninfa Linden, M.D.     CYB/MEDQ  D:  12/01/2013  T:  12/02/2013  Job:  [id number]

## 2013-12-02 NOTE — Progress Notes (Signed)
UR completed 
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
## 2013-12-02 NOTE — Discharge Instructions (Signed)
Elevate leg above heart , wiggle toes often . Keep dressing clean dry and intact. Non weight bearing left leg. May shower in 2-3 days dry wound areas and apply band-aid over wounds.

## 2013-12-02 NOTE — Care Management Note (Signed)
CARE MANAGEMENT NOTE 12/02/2013  Patient:  JAI, BEAR   Account Number:  [id number]  Date Initiated:  12/02/2013  Documentation initiated by:  Ricki Miller  Subjective/Objective Assessment:   73 yr old female admitted with left trimalleolar fracture, s/p application of external fixator. Patient will return in two weeks for ORIF.     Action/Plan:   Case manager spoke with patient and husband concerning home health and DME needs. Choice offered. Referral called to [place] [place] liaison. DME request called to Jeneen Rinks [facility] DME liaison.   Anticipated DC Date:  12/02/2013   Anticipated DC Plan:  [place]  CM consult      PAC Choice  [place]   Choice offered to / List presented to:  C-1 Patient   DME arranged  [place]  3-N-1      DME agency  [place] arranged  [place].   Status of service:  Completed, signed off Medicare Important Message given?  NA - LOS <3 / Initial given by admissions (If response is "NO", the following Medicare IM given date fields will be blank) Date Medicare IM given:   Medicare IM given by:   Date Additional Medicare IM given:   Additional Medicare IM given by:    Discharge Disposition:  [place]  Per UR Regulation:  Reviewed for med. necessity/level of care/duration of stay

## 2013-12-03 DIAGNOSIS — M25572 Pain in left ankle and joints of left foot: Secondary | ICD-10-CM | POA: Diagnosis not present

## 2013-12-03 DIAGNOSIS — Z9181 History of falling: Secondary | ICD-10-CM | POA: Diagnosis not present

## 2013-12-03 DIAGNOSIS — S82852D Displaced trimalleolar fracture of left lower leg, subsequent encounter for closed fracture with routine healing: Secondary | ICD-10-CM | POA: Diagnosis not present

## 2013-12-03 NOTE — Anesthesia Postprocedure Evaluation (Signed)
Anesthesia Post Note  Patient: Madison Wall  Procedure(s) Performed: Procedure(s) (LRB): EXTERNAL FIXATION LEG (Left)  Anesthesia type: General  Patient location: PACU  Post pain: Pain level controlled and Adequate analgesia  Post assessment: Post-op Vital signs reviewed, Patient's Cardiovascular Status Stable, Respiratory Function Stable, Patent Airway and Pain level controlled  Last Vitals:  Filed Vitals:   12/02/13 0600  BP: 125/68  Pulse: 73  Temp: 36.3 C  Resp: 14    Post vital signs: Reviewed and stable  Level of consciousness: awake, alert  and oriented  Complications: No apparent anesthesia complications

## 2013-12-04 ENCOUNTER — Encounter (HOSPITAL_COMMUNITY): Payer: Self-pay | Admitting: Orthopaedic Surgery

## 2013-12-05 ENCOUNTER — Other Ambulatory Visit (HOSPITAL_COMMUNITY): Payer: Self-pay | Admitting: Orthopaedic Surgery

## 2013-12-06 DIAGNOSIS — S82852D Displaced trimalleolar fracture of left lower leg, subsequent encounter for closed fracture with routine healing: Secondary | ICD-10-CM | POA: Diagnosis not present

## 2013-12-06 DIAGNOSIS — Z9181 History of falling: Secondary | ICD-10-CM | POA: Diagnosis not present

## 2013-12-06 DIAGNOSIS — M25572 Pain in left ankle and joints of left foot: Secondary | ICD-10-CM | POA: Diagnosis not present

## 2013-12-11 ENCOUNTER — Other Ambulatory Visit (HOSPITAL_COMMUNITY): Payer: Self-pay | Admitting: *Deleted

## 2013-12-11 ENCOUNTER — Encounter (HOSPITAL_COMMUNITY): Payer: Self-pay

## 2013-12-11 ENCOUNTER — Encounter (HOSPITAL_COMMUNITY)
Admission: RE | Admit: 2013-12-11 | Discharge: 2013-12-11 | Disposition: A | Discharge status: Home / Self Care | Payer: Medicare Other | Source: Ambulatory Visit | Attending: Orthopaedic Surgery | Admitting: Orthopaedic Surgery

## 2013-12-11 DIAGNOSIS — Z882 Allergy status to sulfonamides status: Secondary | ICD-10-CM | POA: Diagnosis not present

## 2013-12-11 DIAGNOSIS — J45909 Unspecified asthma, uncomplicated: Secondary | ICD-10-CM | POA: Diagnosis not present

## 2013-12-11 DIAGNOSIS — Y998 Other external cause status: Secondary | ICD-10-CM | POA: Diagnosis not present

## 2013-12-11 DIAGNOSIS — K589 Irritable bowel syndrome without diarrhea: Secondary | ICD-10-CM | POA: Diagnosis not present

## 2013-12-11 DIAGNOSIS — Z4789 Encounter for other orthopedic aftercare: Secondary | ICD-10-CM | POA: Diagnosis not present

## 2013-12-11 DIAGNOSIS — Y9289 Other specified places as the place of occurrence of the external cause: Secondary | ICD-10-CM | POA: Diagnosis not present

## 2013-12-11 DIAGNOSIS — S82853A Displaced trimalleolar fracture of unspecified lower leg, initial encounter for closed fracture: Secondary | ICD-10-CM | POA: Diagnosis not present

## 2013-12-11 DIAGNOSIS — F410 Panic disorder [episodic paroxysmal anxiety] without agoraphobia: Secondary | ICD-10-CM | POA: Diagnosis not present

## 2013-12-11 DIAGNOSIS — K219 Gastro-esophageal reflux disease without esophagitis: Secondary | ICD-10-CM | POA: Diagnosis not present

## 2013-12-11 DIAGNOSIS — Y9301 Activity, walking, marching and hiking: Secondary | ICD-10-CM | POA: Diagnosis not present

## 2013-12-11 DIAGNOSIS — Z885 Allergy status to narcotic agent status: Secondary | ICD-10-CM | POA: Diagnosis not present

## 2013-12-11 DIAGNOSIS — Z91041 Radiographic dye allergy status: Secondary | ICD-10-CM | POA: Diagnosis not present

## 2013-12-11 DIAGNOSIS — W108XXA Fall (on) (from) other stairs and steps, initial encounter: Secondary | ICD-10-CM | POA: Diagnosis not present

## 2013-12-11 HISTORY — DX: Unspecified asthma, uncomplicated: J45.909

## 2013-12-11 HISTORY — DX: Other complications of anesthesia, initial encounter: T88.59XA

## 2013-12-11 HISTORY — DX: Nausea with vomiting, unspecified: Z98.890

## 2013-12-11 HISTORY — DX: Reserved for concepts with insufficient information to code with codable children: IMO0002

## 2013-12-11 HISTORY — DX: Pneumonia, unspecified organism: J18.9

## 2013-12-11 HISTORY — DX: Nausea with vomiting, unspecified: R11.2

## 2013-12-11 HISTORY — DX: Unspecified hemorrhoids: K64.9

## 2013-12-11 HISTORY — DX: Headache: R51

## 2013-12-11 HISTORY — DX: Gastro-esophageal reflux disease without esophagitis: K21.9

## 2013-12-11 HISTORY — DX: Irritable bowel syndrome, unspecified: K58.9

## 2013-12-11 HISTORY — DX: Anemia, unspecified: D64.9

## 2013-12-11 HISTORY — DX: Phlebitis and thrombophlebitis of unspecified site: I80.9

## 2013-12-11 HISTORY — DX: Adverse effect of unspecified anesthetic, initial encounter: T41.45XA

## 2013-12-11 HISTORY — DX: Rectocele: N81.6

## 2013-12-11 MED ORDER — CEFAZOLIN SODIUM-DEXTROSE 2-3 GM-% IV SOLR
2.0000 g | INTRAVENOUS | Status: AC
  Administered 2013-12-12: 2 g via INTRAVENOUS
  Filled 2013-12-11: qty 50

## 2013-12-11 NOTE — Pre-Procedure Instructions (Signed)
Madison Wall  12/11/2013   Your procedure is scheduled on:  Tuesday, December 12, 2013 at 3:25 PM.   Report to [facility] Entrance "A" Admitting Office at 1:30 PM.   Call this number if you have problems the morning of surgery: [phone]   Remember:   Do not eat food or drink liquids after midnight tonight.   Take these medicines the morning of surgery with A SIP OF WATER: cetirizine (ZYRTEC), estrogens, conjugated, (PREMARIN), acetaminophen-codeine (TYLENOL #3) - if needed, cyclobenzaprine (FLEXERIL) - if needed. You may use your eye drops.    Do not wear jewelry, make-up or nail polish.  Do not wear lotions, powders, or perfumes. You may wear deodorant.  Do not shave 48 hours prior to surgery.   Do not bring valuables to the hospital.  [facility] is not responsible                  for any belongings or valuables.               Contacts, dentures or bridgework may not be worn into surgery.  Leave suitcase in the car. After surgery it may be brought to your room.  For patients admitted to the hospital, discharge time is determined by your                treatment team.               Special Instructions: [facility] - Preparing for Surgery  Before surgery, you can play an important role.  Because skin is not sterile, your skin needs to be as free of germs as possible.  You can reduce the number of germs on you skin by washing with CHG (chlorahexidine gluconate) soap before surgery.  CHG is an antiseptic cleaner which kills germs and bonds with the skin to continue killing germs even after washing.  Please DO NOT use if you have an allergy to CHG or antibacterial soaps.  If your skin becomes reddened/irritated stop using the CHG and inform your nurse when you arrive at Short Stay.  Do not shave (including legs and underarms) for at least 48 hours prior to the first CHG shower.  You may shave your face.  Please follow these instructions carefully:   1.  Shower  with CHG Soap the night before surgery and the                                morning of Surgery.  2.  If you choose to wash your hair, wash your hair first as usual with your       normal shampoo.  3.  After you shampoo, rinse your hair and body thoroughly to remove the                      Shampoo.  4.  Use CHG as you would any other liquid soap.  You can apply chg directly       to the skin and wash gently with scrungie or a clean washcloth.  5.  Apply the CHG Soap to your body ONLY FROM THE NECK DOWN.        Do not use on open wounds or open sores.  Avoid contact with your eyes, ears, mouth and genitals (private parts).  Wash genitals (private parts) with your normal soap.  6.  Wash thoroughly, paying special attention to  the area where your surgery        will be performed.  7.  Thoroughly rinse your body with warm water from the neck down.  8.  DO NOT shower/wash with your normal soap after using and rinsing off       the CHG Soap.  9.  Pat yourself dry with a clean towel.            10.  Wear clean pajamas.            11.  Place clean sheets on your bed the night of your first shower and do not        sleep with pets.  Day of Surgery  Do not apply any lotions the morning of surgery.  Please wear clean clothes to the hospital/surgery center.     Please read over the following fact sheets that you were given: Pain Booklet, Coughing and Deep Breathing and Surgical Site Infection Prevention

## 2013-12-12 ENCOUNTER — Encounter (HOSPITAL_COMMUNITY): Payer: Self-pay | Admitting: *Deleted

## 2013-12-12 ENCOUNTER — Ambulatory Visit (HOSPITAL_COMMUNITY): Payer: Medicare Other

## 2013-12-12 ENCOUNTER — Ambulatory Visit (HOSPITAL_COMMUNITY): Payer: Medicare Other | Admitting: Anesthesiology

## 2013-12-12 ENCOUNTER — Encounter (HOSPITAL_COMMUNITY)
Admission: RE | Disposition: A | Discharge status: Home / Self Care | Payer: Self-pay | Source: Ambulatory Visit | Attending: Orthopaedic Surgery

## 2013-12-12 ENCOUNTER — Encounter (HOSPITAL_COMMUNITY): Payer: Medicare Other | Admitting: Anesthesiology

## 2013-12-12 ENCOUNTER — Observation Stay (HOSPITAL_COMMUNITY)
Admission: RE | Admit: 2013-12-12 | Discharge: 2013-12-13 | Disposition: A | Discharge status: Home / Self Care | Payer: Medicare Other | Source: Ambulatory Visit | Attending: Orthopaedic Surgery | Admitting: Orthopaedic Surgery

## 2013-12-12 DIAGNOSIS — Z882 Allergy status to sulfonamides status: Secondary | ICD-10-CM | POA: Insufficient documentation

## 2013-12-12 DIAGNOSIS — K219 Gastro-esophageal reflux disease without esophagitis: Secondary | ICD-10-CM | POA: Diagnosis not present

## 2013-12-12 DIAGNOSIS — Z4789 Encounter for other orthopedic aftercare: Secondary | ICD-10-CM | POA: Diagnosis not present

## 2013-12-12 DIAGNOSIS — W108XXA Fall (on) (from) other stairs and steps, initial encounter: Secondary | ICD-10-CM | POA: Insufficient documentation

## 2013-12-12 DIAGNOSIS — IMO0002 Reserved for concepts with insufficient information to code with codable children: Secondary | ICD-10-CM | POA: Diagnosis not present

## 2013-12-12 DIAGNOSIS — S82853A Displaced trimalleolar fracture of unspecified lower leg, initial encounter for closed fracture: Secondary | ICD-10-CM | POA: Diagnosis not present

## 2013-12-12 DIAGNOSIS — K589 Irritable bowel syndrome without diarrhea: Secondary | ICD-10-CM | POA: Insufficient documentation

## 2013-12-12 DIAGNOSIS — S82853B Displaced trimalleolar fracture of unspecified lower leg, initial encounter for open fracture type I or II: Secondary | ICD-10-CM | POA: Diagnosis not present

## 2013-12-12 DIAGNOSIS — J45909 Unspecified asthma, uncomplicated: Secondary | ICD-10-CM | POA: Insufficient documentation

## 2013-12-12 DIAGNOSIS — Y9289 Other specified places as the place of occurrence of the external cause: Secondary | ICD-10-CM | POA: Insufficient documentation

## 2013-12-12 DIAGNOSIS — Y998 Other external cause status: Secondary | ICD-10-CM | POA: Insufficient documentation

## 2013-12-12 DIAGNOSIS — S82852D Displaced trimalleolar fracture of left lower leg, subsequent encounter for closed fracture with routine healing: Secondary | ICD-10-CM

## 2013-12-12 DIAGNOSIS — F410 Panic disorder [episodic paroxysmal anxiety] without agoraphobia: Secondary | ICD-10-CM | POA: Diagnosis not present

## 2013-12-12 DIAGNOSIS — S82852A Displaced trimalleolar fracture of left lower leg, initial encounter for closed fracture: Secondary | ICD-10-CM | POA: Diagnosis present

## 2013-12-12 DIAGNOSIS — Z885 Allergy status to narcotic agent status: Secondary | ICD-10-CM | POA: Insufficient documentation

## 2013-12-12 DIAGNOSIS — S8253XA Displaced fracture of medial malleolus of unspecified tibia, initial encounter for closed fracture: Secondary | ICD-10-CM | POA: Diagnosis not present

## 2013-12-12 DIAGNOSIS — Y9301 Activity, walking, marching and hiking: Secondary | ICD-10-CM | POA: Insufficient documentation

## 2013-12-12 DIAGNOSIS — Z91041 Radiographic dye allergy status: Secondary | ICD-10-CM | POA: Insufficient documentation

## 2013-12-12 HISTORY — PX: ORIF ANKLE FRACTURE: SHX5408

## 2013-12-12 HISTORY — PX: EXTERNAL FIXATION REMOVAL: SHX5040

## 2013-12-12 SURGERY — OPEN REDUCTION INTERNAL FIXATION (ORIF) ANKLE FRACTURE
Anesthesia: General | Site: Ankle | Laterality: Left

## 2013-12-12 MED ORDER — METOCLOPRAMIDE HCL 5 MG PO TABS: 5.0000 mg | ORAL_TABLET | Freq: Three times a day (TID) | ORAL | Status: DC | PRN

## 2013-12-12 MED ORDER — FENTANYL CITRATE 0.05 MG/ML IJ SOLN
INTRAMUSCULAR | Status: DC | PRN
Start: 2013-12-12 — End: 2013-12-12
  Administered 2013-12-12: 50 ug via INTRAVENOUS
  Administered 2013-12-12: 25 ug via INTRAVENOUS
  Administered 2013-12-12: 50 ug via INTRAVENOUS
  Administered 2013-12-12: 25 ug via INTRAVENOUS
  Administered 2013-12-12: 50 ug via INTRAVENOUS

## 2013-12-12 MED ORDER — 0.9 % SODIUM CHLORIDE (POUR BTL) OPTIME
TOPICAL | Status: DC | PRN
  Administered 2013-12-12: 1000 mL

## 2013-12-12 MED ORDER — ARTIFICIAL TEARS OP OINT
TOPICAL_OINTMENT | OPHTHALMIC | Status: DC | PRN
  Administered 2013-12-12: 1 via OPHTHALMIC

## 2013-12-12 MED ORDER — MIDAZOLAM HCL 2 MG/2ML IJ SOLN
INTRAMUSCULAR | Status: AC
  Filled 2013-12-12: qty 2

## 2013-12-12 MED ORDER — HYDROMORPHONE HCL 1 MG/ML IJ SOLN
0.5000 mg | INTRAMUSCULAR | Status: DC | PRN
  Administered 2013-12-12 – 2013-12-13 (×4): 0.5 mg via INTRAVENOUS
  Filled 2013-12-12 (×4): qty 1

## 2013-12-12 MED ORDER — DIPHENHYDRAMINE HCL 50 MG/ML IJ SOLN
INTRAMUSCULAR | Status: DC | PRN
  Administered 2013-12-12: 10 mg via INTRAVENOUS

## 2013-12-12 MED ORDER — ONDANSETRON HCL 4 MG/2ML IJ SOLN
INTRAMUSCULAR | Status: DC | PRN
Start: 2013-12-12 — End: 2013-12-12
  Administered 2013-12-12: 4 mg via INTRAVENOUS

## 2013-12-12 MED ORDER — TRAMADOL HCL 50 MG PO TABS
100.0000 mg | ORAL_TABLET | Freq: Four times a day (QID) | ORAL | Status: DC | PRN
  Administered 2013-12-13: 100 mg via ORAL
  Filled 2013-12-12: qty 2

## 2013-12-12 MED ORDER — FENTANYL CITRATE 0.05 MG/ML IJ SOLN
INTRAMUSCULAR | Status: AC
  Administered 2013-12-12: 100 ug
  Filled 2013-12-12: qty 2

## 2013-12-12 MED ORDER — DIPHENHYDRAMINE HCL 12.5 MG/5ML PO ELIX
12.5000 mg | ORAL_SOLUTION | ORAL | Status: DC | PRN
  Filled 2013-12-12: qty 10

## 2013-12-12 MED ORDER — PROPOFOL 10 MG/ML IV BOLUS
INTRAVENOUS | Status: AC
  Filled 2013-12-12: qty 20

## 2013-12-12 MED ORDER — DIPHENHYDRAMINE HCL 50 MG/ML IJ SOLN
INTRAMUSCULAR | Status: AC
  Filled 2013-12-12: qty 1

## 2013-12-12 MED ORDER — CYCLOSPORINE 0.05 % OP EMUL
1.0000 [drp] | Freq: Two times a day (BID) | OPHTHALMIC | Status: DC
  Administered 2013-12-12 – 2013-12-13 (×2): 1 [drp] via OPHTHALMIC
  Filled 2013-12-12 (×3): qty 1

## 2013-12-12 MED ORDER — POLYETHYLENE GLYCOL 3350 17 G PO PACK
17.0000 g | PACK | Freq: Every day | ORAL | Status: DC | PRN
  Filled 2013-12-12 (×2): qty 1

## 2013-12-12 MED ORDER — METHOCARBAMOL 1000 MG/10ML IJ SOLN
500.0000 mg | Freq: Four times a day (QID) | INTRAVENOUS | Status: DC | PRN
  Administered 2013-12-12: 500 mg via INTRAVENOUS
  Filled 2013-12-12: qty 5

## 2013-12-12 MED ORDER — ONDANSETRON HCL 4 MG/2ML IJ SOLN: 4.0000 mg | Freq: Four times a day (QID) | INTRAMUSCULAR | Status: DC | PRN

## 2013-12-12 MED ORDER — ALPRAZOLAM 0.25 MG PO TABS: 0.2500 mg | ORAL_TABLET | Freq: Every evening | ORAL | Status: DC | PRN

## 2013-12-12 MED ORDER — SCOPOLAMINE 1 MG/3DAYS TD PT72
1.0000 | MEDICATED_PATCH | TRANSDERMAL | Status: DC
  Administered 2013-12-12: 1 via TRANSDERMAL
  Administered 2013-12-12: 1.5 mg via TRANSDERMAL

## 2013-12-12 MED ORDER — HYDROMORPHONE HCL 1 MG/ML IJ SOLN
0.2500 mg | INTRAMUSCULAR | Status: DC | PRN
  Administered 2013-12-12 (×2): 0.5 mg via INTRAVENOUS

## 2013-12-12 MED ORDER — HYDROMORPHONE HCL 1 MG/ML IJ SOLN
INTRAMUSCULAR | Status: AC
  Filled 2013-12-12: qty 1

## 2013-12-12 MED ORDER — ZOLPIDEM TARTRATE 5 MG PO TABS
5.0000 mg | ORAL_TABLET | Freq: Every evening | ORAL | Status: DC | PRN
  Filled 2013-12-12: qty 1

## 2013-12-12 MED ORDER — LACTATED RINGERS IV SOLN
INTRAVENOUS | Status: DC
  Administered 2013-12-12: 14:00:00 via INTRAVENOUS

## 2013-12-12 MED ORDER — CYCLOBENZAPRINE HCL 10 MG PO TABS: 10.0000 mg | ORAL_TABLET | Freq: Three times a day (TID) | ORAL | Status: DC | PRN

## 2013-12-12 MED ORDER — PROMETHAZINE HCL 25 MG/ML IJ SOLN: 6.2500 mg | INTRAMUSCULAR | Status: DC | PRN

## 2013-12-12 MED ORDER — SODIUM CHLORIDE 0.9 % IV SOLN
INTRAVENOUS | Status: DC
  Administered 2013-12-12: 20:00:00 via INTRAVENOUS

## 2013-12-12 MED ORDER — DOCUSATE SODIUM 100 MG PO CAPS
100.0000 mg | ORAL_CAPSULE | Freq: Two times a day (BID) | ORAL | Status: DC
  Administered 2013-12-12 – 2013-12-13 (×2): 100 mg via ORAL
  Filled 2013-12-12 (×4): qty 1

## 2013-12-12 MED ORDER — METOCLOPRAMIDE HCL 5 MG/ML IJ SOLN: 5.0000 mg | Freq: Three times a day (TID) | INTRAMUSCULAR | Status: DC | PRN

## 2013-12-12 MED ORDER — ACETAMINOPHEN-CODEINE #3 300-30 MG PO TABS
1.0000 | ORAL_TABLET | ORAL | Status: DC | PRN
  Administered 2013-12-13: 1 via ORAL
  Filled 2013-12-12: qty 2

## 2013-12-12 MED ORDER — SCOPOLAMINE 1 MG/3DAYS TD PT72
MEDICATED_PATCH | TRANSDERMAL | Status: AC
  Administered 2013-12-12: 1.5 mg via TRANSDERMAL
  Filled 2013-12-12: qty 1

## 2013-12-12 MED ORDER — METHOCARBAMOL 500 MG PO TABS
500.0000 mg | ORAL_TABLET | Freq: Four times a day (QID) | ORAL | Status: DC | PRN
  Filled 2013-12-12: qty 1

## 2013-12-12 MED ORDER — DEXAMETHASONE SODIUM PHOSPHATE 4 MG/ML IJ SOLN
INTRAMUSCULAR | Status: DC | PRN
  Administered 2013-12-12: 4 mg via INTRAVENOUS

## 2013-12-12 MED ORDER — LIDOCAINE HCL (CARDIAC) 20 MG/ML IV SOLN
INTRAVENOUS | Status: AC
  Filled 2013-12-12: qty 5

## 2013-12-12 MED ORDER — LACTATED RINGERS IV SOLN
INTRAVENOUS | Status: DC | PRN
  Administered 2013-12-12 (×2): via INTRAVENOUS

## 2013-12-12 MED ORDER — FENTANYL CITRATE 0.05 MG/ML IJ SOLN
INTRAMUSCULAR | Status: AC
  Filled 2013-12-12: qty 5

## 2013-12-12 MED ORDER — PROPOFOL 10 MG/ML IV BOLUS
INTRAVENOUS | Status: DC | PRN
  Administered 2013-12-12: 130 mg via INTRAVENOUS

## 2013-12-12 MED ORDER — CEFAZOLIN SODIUM 1-5 GM-% IV SOLN
1.0000 g | Freq: Four times a day (QID) | INTRAVENOUS | Status: AC
  Administered 2013-12-12 – 2013-12-13 (×3): 1 g via INTRAVENOUS
  Filled 2013-12-12 (×4): qty 50

## 2013-12-12 MED ORDER — MIDAZOLAM HCL 2 MG/2ML IJ SOLN
INTRAMUSCULAR | Status: AC
  Administered 2013-12-12: 1 mg
  Filled 2013-12-12: qty 2

## 2013-12-12 MED ORDER — ONDANSETRON HCL 4 MG PO TABS: 4.0000 mg | ORAL_TABLET | Freq: Four times a day (QID) | ORAL | Status: DC | PRN

## 2013-12-12 SURGICAL SUPPLY — 67 items
BANDAGE ELASTIC 4 VELCRO ST LF (GAUZE/BANDAGES/DRESSINGS) ×2 IMPLANT
BANDAGE ELASTIC 6 VELCRO ST LF (GAUZE/BANDAGES/DRESSINGS) ×2 IMPLANT
BANDAGE ESMARK 6X9 LF (GAUZE/BANDAGES/DRESSINGS) IMPLANT
BIT DRILL 2.7 QC CANN 155 (BIT) ×2 IMPLANT
BIT DRILL QC 2.7 6.3IN  SHORT (BIT) ×1
BIT DRILL QC 2.7 6.3IN SHORT (BIT) ×1 IMPLANT
BNDG ESMARK 6X9 LF (GAUZE/BANDAGES/DRESSINGS)
CANISTER SUCTION 2500CC (MISCELLANEOUS) ×2 IMPLANT
COVER SURGICAL LIGHT HANDLE (MISCELLANEOUS) ×2 IMPLANT
CUFF TOURNIQUET SINGLE 34IN LL (TOURNIQUET CUFF) ×2 IMPLANT
CUFF TOURNIQUET SINGLE 44IN (TOURNIQUET CUFF) IMPLANT
DRAPE C-ARM 42X72 X-RAY (DRAPES) ×2 IMPLANT
DRAPE ORTHO SPLIT 77X108 STRL (DRAPES) ×2
DRAPE PROXIMA HALF (DRAPES) ×4 IMPLANT
DRAPE SURG ORHT 6 SPLT 77X108 (DRAPES) ×2 IMPLANT
DRAPE U-SHAPE 47X51 STRL (DRAPES) ×2 IMPLANT
DURAPREP 26ML APPLICATOR (WOUND CARE) ×4 IMPLANT
ELECT CAUTERY BLADE 6.4 (BLADE) ×2 IMPLANT
ELECT REM PT RETURN 9FT ADLT (ELECTROSURGICAL) ×2
ELECTRODE REM PT RTRN 9FT ADLT (ELECTROSURGICAL) ×1 IMPLANT
FACESHIELD STD STERILE (MASK) ×2 IMPLANT
GAUZE SPONGE 4X4 12PLY STRL (GAUZE/BANDAGES/DRESSINGS) ×2 IMPLANT
GAUZE XEROFORM 5X9 LF (GAUZE/BANDAGES/DRESSINGS) ×2 IMPLANT
GLOVE BIO SURGEON STRL SZ8 (GLOVE) ×2 IMPLANT
GLOVE ORTHO TXT STRL SZ7.5 (GLOVE) ×2 IMPLANT
GOWN STRL REUS W/ TWL LRG LVL3 (GOWN DISPOSABLE) ×2 IMPLANT
GOWN STRL REUS W/ TWL XL LVL3 (GOWN DISPOSABLE) ×4 IMPLANT
GOWN STRL REUS W/TWL LRG LVL3 (GOWN DISPOSABLE) ×2
GOWN STRL REUS W/TWL XL LVL3 (GOWN DISPOSABLE) ×4
GUIDE PIN 1.3 (Pin) ×4 IMPLANT
KIT BASIN OR (CUSTOM PROCEDURE TRAY) ×2 IMPLANT
KIT ROOM TURNOVER OR (KITS) ×2 IMPLANT
MANIFOLD NEPTUNE II (INSTRUMENTS) IMPLANT
NEEDLE HYPO 25GX1X1/2 BEV (NEEDLE) IMPLANT
NS IRRIG 1000ML POUR BTL (IV SOLUTION) ×2 IMPLANT
PACK ORTHO EXTREMITY (CUSTOM PROCEDURE TRAY) ×2 IMPLANT
PAD ARMBOARD 7.5X6 YLW CONV (MISCELLANEOUS) ×4 IMPLANT
PAD CAST 4YDX4 CTTN HI CHSV (CAST SUPPLIES) ×1 IMPLANT
PADDING CAST COTTON 4X4 STRL (CAST SUPPLIES) ×1
PADDING CAST COTTON 6X4 STRL (CAST SUPPLIES) ×2 IMPLANT
PIN GUIDE 1.3 (Pin) ×2 IMPLANT
PLATE FIBULA DISTAL 5 HOLE (Plate) ×2 IMPLANT
PREFILTER NEPTUNE (MISCELLANEOUS) IMPLANT
SCREW 5.0X12 (Screw) ×4 IMPLANT
SCREW CANNULATED 4.0X44 (Screw) ×4 IMPLANT
SCREW LOCK 12X3.5XST PRLC (Screw) ×2 IMPLANT
SCREW LOCK 3.5X12 (Screw) ×2 IMPLANT
SCREW LOCK 3.5X14 (Screw) ×2 IMPLANT
SCREW NL 3.5X14 (Screw) ×2 IMPLANT
SCREW NON LOCK 3.5X12 (Screw) ×2 IMPLANT
SCREW NONLOCK 3.5X10 (Screw) ×2 IMPLANT
SPLINT PLASTER CAST XFAST 5X30 (CAST SUPPLIES) ×1 IMPLANT
SPLINT PLASTER XFAST SET 5X30 (CAST SUPPLIES) ×1
SPONGE LAP 4X18 X RAY DECT (DISPOSABLE) ×2 IMPLANT
SUCTION FRAZIER TIP 10 FR DISP (SUCTIONS) ×2 IMPLANT
SUT ETHILON 2 0 FS 18 (SUTURE) ×8 IMPLANT
SUT VIC AB 0 CT1 27 (SUTURE) ×3
SUT VIC AB 0 CT1 27XBRD ANBCTR (SUTURE) ×3 IMPLANT
SUT VIC AB 2-0 CT1 27 (SUTURE) ×1
SUT VIC AB 2-0 CT1 27XBRD (SUTURE) IMPLANT
SUT VIC AB 2-0 CT1 TAPERPNT 27 (SUTURE) ×1 IMPLANT
SUT VICRYL 0 CT 1 36IN (SUTURE) IMPLANT
SYR CONTROL 10ML LL (SYRINGE) ×2 IMPLANT
TOWEL OR 17X24 6PK STRL BLUE (TOWEL DISPOSABLE) ×2 IMPLANT
TOWEL OR 17X26 10 PK STRL BLUE (TOWEL DISPOSABLE) ×2 IMPLANT
TUBE CONNECTING 12X1/4 (SUCTIONS) ×2 IMPLANT
WATER STERILE IRR 1000ML POUR (IV SOLUTION) IMPLANT

## 2013-12-12 NOTE — Anesthesia Postprocedure Evaluation (Signed)
Anesthesia Post Note  Patient: Madison Wall  Procedure(s) Performed: Procedure(s) (LRB): REMOVAL OF EXTERNAL FIXATION LEFT ANKLE AND OPEN REDUCTION INTERNAL FIXATION (ORIF) LEFT TRIMALLEOLAR ANKLE FRACTURE (Left) REMOVAL EXTERNAL FIXATION LEG (Left)  Anesthesia type: general  Patient location: PACU  Post pain: Pain level controlled  Post assessment: Patient's Cardiovascular Status Stable  Last Vitals:  Filed Vitals:   12/12/13 1730  BP: 131/87  Pulse: 90  Temp:   Resp: 14    Post vital signs: Reviewed and stable  Level of consciousness: sedated  Complications: No apparent anesthesia complications

## 2013-12-12 NOTE — Transfer of Care (Signed)
Immediate Anesthesia Transfer of Care Note  Patient: Madison Wall  Procedure(s) Performed: Procedure(s): REMOVAL OF EXTERNAL FIXATION LEFT ANKLE AND OPEN REDUCTION INTERNAL FIXATION (ORIF) LEFT TRIMALLEOLAR ANKLE FRACTURE (Left) REMOVAL EXTERNAL FIXATION LEG (Left)  Patient Location: PACU  Anesthesia Type:GA combined with regional for post-op pain  Level of Consciousness: awake, alert  and oriented  Airway & Oxygen Therapy: Patient Spontanous Breathing and Patient connected to nasal cannula oxygen  Post-op Assessment: Report given to PACU RN and Post -op Vital signs reviewed and stable  Post vital signs: Reviewed and stable  Complications: No apparent anesthesia complications

## 2013-12-12 NOTE — Anesthesia Preprocedure Evaluation (Signed)
Anesthesia Evaluation  Patient identified by MRN, date of birth, ID band Patient awake    Reviewed: Allergy & Precautions, H&P , NPO status , Patient's Chart, lab work & pertinent test results  History of Anesthesia Complications (+) PONV and history of anesthetic complications  Airway Mallampati: II TM Distance: >3 FB Neck ROM: Full    Dental  (+) Teeth Intact, Dental Advisory Given   Pulmonary asthma , former smoker,    Pulmonary exam normal       Cardiovascular negative cardio ROS      Neuro/Psych  Headaches, Anxiety    GI/Hepatic Neg liver ROS, GERD-  Medicated,  Endo/Other  negative endocrine ROS  Renal/GU negative Renal ROS     Musculoskeletal   Abdominal   Peds  Hematology   Anesthesia Other Findings   Reproductive/Obstetrics                           Anesthesia Physical Anesthesia Plan  ASA: III  Anesthesia Plan: General   Post-op Pain Management:    Induction: Intravenous  Airway Management Planned: LMA  Additional Equipment:   Intra-op Plan:   Post-operative Plan: Extubation in OR  Informed Consent: I have reviewed the patients History and Physical, chart, labs and discussed the procedure including the risks, benefits and alternatives for the proposed anesthesia with the patient or authorized representative who has indicated his/her understanding and acceptance.   Dental advisory given  Plan Discussed with: CRNA, Anesthesiologist and Surgeon  Anesthesia Plan Comments:         Anesthesia Quick Evaluation

## 2013-12-12 NOTE — Anesthesia Procedure Notes (Signed)
Procedure Name: LMA Insertion Date/Time: 12/12/2013 2:57 PM Performed by: Ollen Bowl Pre-anesthesia Checklist: Patient identified, Emergency Drugs available, Suction available, Patient being monitored and Timeout performed Patient Re-evaluated:Patient Re-evaluated prior to inductionOxygen Delivery Method: Circle system utilized and Simple face mask Preoxygenation: Pre-oxygenation with 100% oxygen Intubation Type: IV induction Ventilation: Mask ventilation without difficulty LMA Size: 4.0 Number of attempts: 1 Airway Equipment and Method: Patient positioned with wedge pillow Placement Confirmation: positive ETCO2 and breath sounds checked- equal and bilateral Tube secured with: Tape Dental Injury: Teeth and Oropharynx as per pre-operative assessment
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
## 2013-12-12 NOTE — Brief Op Note (Signed)
12/12/2013  4:34 PM  PATIENT:  Madison Wall  73 y.o. female  PRE-OPERATIVE DIAGNOSIS:  Left trimalleolar ankle fracture  POST-OPERATIVE DIAGNOSIS:  Left trimalleolar ankle fracture  PROCEDURE:  Procedure(s): REMOVAL OF EXTERNAL FIXATION LEFT ANKLE AND OPEN REDUCTION INTERNAL FIXATION (ORIF) LEFT TRIMALLEOLAR ANKLE FRACTURE (Left) REMOVAL EXTERNAL FIXATION LEG (Left)  SURGEON:  Surgeon(s) and Role:    * Mcarthur Rossetti, MD - Primary  PHYSICIAN ASSISTANT: Benita Stabile, PA-C  ANESTHESIA:   regional and general  EBL:  Total I/O In: 1000 [I.V.:1000] Out: -   BLOOD ADMINISTERED:none  DRAINS: none   LOCAL MEDICATIONS USED:  NONE  SPECIMEN:  No Specimen  DISPOSITION OF SPECIMEN:  N/A  COUNTS:  YES  TOURNIQUET:  * Missing tourniquet times found for documented tourniquets in log:  [id number] *  DICTATION: .Other Dictation: Dictation Number [id number]  PLAN OF CARE: Admit for overnight observation  PATIENT DISPOSITION:  PACU - hemodynamically stable.   Delay start of Pharmacological VTE agent (>24hrs) due to surgical blood loss or risk of bleeding: no

## 2013-12-13 ENCOUNTER — Encounter (HOSPITAL_COMMUNITY): Payer: Self-pay | Admitting: *Deleted

## 2013-12-13 DIAGNOSIS — S82853A Displaced trimalleolar fracture of unspecified lower leg, initial encounter for closed fracture: Secondary | ICD-10-CM | POA: Diagnosis not present

## 2013-12-13 DIAGNOSIS — Z9181 History of falling: Secondary | ICD-10-CM | POA: Diagnosis not present

## 2013-12-13 DIAGNOSIS — S82852D Displaced trimalleolar fracture of left lower leg, subsequent encounter for closed fracture with routine healing: Secondary | ICD-10-CM | POA: Diagnosis not present

## 2013-12-13 DIAGNOSIS — M25572 Pain in left ankle and joints of left foot: Secondary | ICD-10-CM | POA: Diagnosis not present

## 2013-12-13 MED ORDER — FLUCONAZOLE 150 MG PO TABS
150.0000 mg | ORAL_TABLET | Freq: Once | ORAL | Status: AC
  Administered 2013-12-13: 150 mg via ORAL
  Filled 2013-12-13: qty 1

## 2013-12-13 MED ORDER — OXYCODONE-ACETAMINOPHEN 5-325 MG PO TABS: 1.0000 | ORAL_TABLET | ORAL | Status: DC | PRN
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
## 2013-12-13 NOTE — Evaluation (Signed)
Physical Therapy Evaluation Patient Details Name: Madison Wall MRN: [id number] DOB: 1940/09/19 Today's Date: 12/13/2013   History of Present Illness  This admission for removal of external fixation  Clinical Impression  Patient evaluated by Physical Therapy with no further acute PT needs identified. All education has been completed and the patient has no further questions.  See below for any follow-up Physical Therapy or equipment needs. PT is signing off. Thank you for this referral.     Follow Up Recommendations Home health PT;Supervision for mobility/OOB    Equipment Recommendations  Other (comment) (knee walker -- this can be addressed at MD f/u)    Recommendations for Other Services       Precautions / Restrictions Precautions Precautions: Fall Restrictions Weight Bearing Restrictions: Yes LLE Weight Bearing: Non weight bearing      Mobility  Bed Mobility Overal bed mobility: Modified Independent Bed Mobility: Supine to Sit              Transfers Overall transfer level: Needs assistance Equipment used: Rolling walker (2 wheeled) Transfers: Sit to/from Stand Sit to Stand: Supervision         General transfer comment: Supervision for safety. VC for hand placement. Able to safely maintain NWB on LLE.  Ambulation/Gait Ambulation/Gait assistance: Min guard;Supervision Ambulation Distance (Feet): 15 Feet Assistive device: Rolling walker (2 wheeled) Gait Pattern/deviations: Step-to pattern     General Gait Details: Verbal and demo cues for safe use of RW; good maintenance of NWB; cues to slow down slightly during turns  Science writer    Modified Rankin (Stroke Patients Only)       Balance             Standing balance-Leahy Scale: Fair                               Pertinent Vitals/Pain Pain Assessment: 0-10 Pain Score: 4  Pain Location: Lt ankle Pain Descriptors / Indicators: Aching Pain  Intervention(s): Monitored during session;Repositioned;Premedicated before session    Home Living Family/patient expects to be discharged to:: Private residence Living Arrangements: Spouse/significant other Available Help at Discharge: Family;Available 24 hours/day Type of Home: House Home Access: Stairs to enter Entrance Stairs-Rails:  (they have a temporary ramp) Entrance Stairs-Number of Steps: 3 Home Layout: Two level;Able to live on main level with bedroom/bathroom Home Equipment: Gilford Rile - 2 wheels;Bedside commode;Wheelchair - manual;Grab bars - toilet Additional Comments: Had been managing NWBing LLE for a few weeks with external fixator    Prior Function Level of Independence: Independent               Hand Dominance   Dominant Hand: Right    Extremity/Trunk Assessment   Upper Extremity Assessment: Overall WFL for tasks assessed           Lower Extremity Assessment: LLE deficits/detail   LLE Deficits / Details: Decreased strength and ROM as expected post op. able to move toes, normal sensation at toes.      Communication   Communication: No difficulties  Cognition Arousal/Alertness: Awake/alert Behavior During Therapy: WFL for tasks assessed/performed Overall Cognitive Status: Within Functional Limits for tasks assessed                      General Comments      Exercises        Assessment/Plan  PT Assessment All further PT needs can be met in the next venue of care  PT Diagnosis Difficulty walking;Abnormality of gait;Acute pain   PT Problem List Decreased strength;Decreased range of motion;Decreased activity tolerance;Decreased balance;Decreased mobility;Decreased knowledge of use of DME;Pain  PT Treatment Interventions DME instruction;Gait training;Functional mobility training;Therapeutic activities;Stair training;Therapeutic exercise;Balance training;Neuromuscular re-education;Patient/family education;Modalities;Wheelchair mobility  training   PT Goals (Current goals can be found in the Care Plan section) Acute Rehab PT Goals Patient Stated Goal: Go home PT Goal Formulation: No goals set, d/c therapy    Frequency     Barriers to discharge        Co-evaluation               End of Session   Activity Tolerance: Patient tolerated treatment well Patient left: in chair;with call bell/phone within reach;with family/visitor present Nurse Communication: Mobility status    Functional Assessment Tool Used: clinical observation Functional Limitation: Mobility: Walking and moving around Mobility: Walking and Moving Around Current Status (X8338): At least 1 percent but less than 20 percent impaired, limited or restricted Mobility: Walking and Moving Around Goal Status [id number]): 0 percent impaired, limited or restricted Mobility: Walking and Moving Around Discharge Status [id number]): At least 1 percent but less than 20 percent impaired, limited or restricted    Time: 4193-7902 PT Time Calculation (min): 27 min   Charges:   PT Evaluation $Initial PT Evaluation Tier I: 1 Procedure PT Treatments $Gait Training: 8-22 mins   PT G Codes:   Functional Assessment Tool Used: clinical observation Functional Limitation: Mobility: Walking and moving around    [place] 12/13/2013, 11:54 AM  Roney Marion, PT  Acute Rehabilitation Services Pager [phone] Office [phone]

## 2013-12-13 NOTE — Progress Notes (Signed)
Patient ID: Madison Wall, female   DOB: 1941/02/22, 73 y.o.   MRN: [id number] Doing well.  Can discharge to home today after PT gets her up.

## 2013-12-13 NOTE — Progress Notes (Signed)
CARE MANAGEMENT NOTE 12/13/2013  Patient:  Madison Wall, Madison Wall   Account Number:  [id number]  Date Initiated:  12/13/2013  Documentation initiated by:  Lizabeth Leyden  Subjective/Objective Assessment:   admitted for fixation of left trimalleolar ankle fracture  INPT: 12/01/2013 post fall for Left ankle fracture     Action/Plan:   active with [facility] for HHPT, to resume home health services   Anticipated DC Date:  12/13/2013   Anticipated DC Plan:  [place]  CM consult      [facility] Choice  Resumption Of Svcs/PTA Provider   Choice offered to / List presented to:             Status of service:  Completed, signed off Medicare Important Message given?   (If response is "NO", the following Medicare IM given date fields will be blank) Date Medicare IM given:   Medicare IM given by:   Date Additional Medicare IM given:   Additional Medicare IM given by:    Discharge Disposition:    Per UR Regulation:    If discussed at Long Length of Stay Meetings, dates discussed:    Comments:  12/13/2013  [place], [place] [phone] CM referral: home health services  Met with patient regarding discharge planning and home health services. She was inpatient after fall on 12/01/2013 and discharged home with PT, RW, WC. She requests to continue with [facility] for HHPT. She noted the MD mentioned getting a knee scooter, she will wait and discuss with home health PT.  [facility]/Donna called to advise of admission and discharge to resume home health

## 2013-12-13 NOTE — Progress Notes (Signed)
New Admission Note:  Arrival Method: via stretcher from PACU  Mental Orientation: Alert and Oriented x4 Telemetry: N/A Assessment: Completed Skin: Intact. Dressing to LLE; UTA IV: Peripheral Left Hand Infusing NS @ 75cc Pain: 5/10 to Ankle on Left Lower Extremity  Tubes: Secured Safety Measures: Safety Fall Prevention Plan has been given and discussed [place] Orientation: Patient has been orientated to the room, unit and staff.  Family: Husband at bedside  Orders have been reviewed and implemented. Will continue to monitor the patient. Call light has been placed within reach and bed alarm has been activated.    Dorothea Glassman, RN  Phone number: [phone]

## 2013-12-13 NOTE — Op Note (Signed)
NAMESHAQUOIA, Madison Wall          ACCOUNT NO.:  [id number]  MEDICAL RECORD NO.:  [id number]  LOCATION:  [id number]                        FACILITY:  [facility]  PHYSICIAN:  Lind Guest. Ninfa Linden, M.D.DATE OF BIRTH:  08-28-1940  DATE OF PROCEDURE:  12/12/2013 DATE OF DISCHARGE:                              OPERATIVE REPORT   PREOPERATIVE DIAGNOSIS:  Left unstable trimalleolar ankle fracture dislocation status post external fixation.  POSTOPERATIVE DIAGNOSIS:  Left unstable trimalleolar ankle fracture dislocation status post external fixation.  PROCEDURE: 1. Removal of external fixation spanning left ankle joint. 2. Reduction and fixation of left trimalleolar ankle fracture with     fixation of lateral and medial malleolus only.  IMPLANTS:  [employer] 5 hole periarticular fibular plate laterally and 2 cancellous screws medially.  SURGEON:  Jean Rosenthal MD.  ASSISTANT:  Erskine Emery, PA-C.  ANESTHESIA: 1. Left leg popliteal block. 2. General.  TOURNIQUET TIME:  Under 2 hours.  BLOOD LOSS:  Under 50 mL.  COMPLICATIONS:  None.  INDICATIONS:  Madison Wall is a very pleasant 73 year old female who about 10 days ago fell coming down some steps and sustained unstable left ankle fracture dislocation.  Denied of her injury. We had to take her to the operating room for external fixation to hold the ankle reduced, then to allow the soft tissue swelling to subside.  She is now presenting to the operating room for definitive fixation of her ankle fracture.  She understands fully the risks and benefits of surgery and the reason behind proceeding to the operating room.  PROCEDURE DESCRIPTION:  After informed consent was obtained and appropriate left leg was marked, she was brought to the operating room and placed supine on the operating table.  General anesthesia was then obtained.  A nonsterile tourniquet placed around her upper left thigh and a time-out was called, and  she was identified as correct patient and correct left ankle.  First, we easily removed the external fixator without difficulty.  We then prepped the left ankle with DuraPrep and sterile drapes.  Time-out was called again, and we identified the correct patient, and correct left ankle.  We then used an Esmarch to wrap out the leg and tourniquet was inflated to 250 mm of pressure.  I made incision along the lateral aspect of the ankle and dissected the fibula.  We found a highly comminuted fibular fracture which we tried to pull out to length to get it reduced as acceptable as possible under direct visualization and fluoroscopy.  I then chose a periarticular fibular plate from [employer] and placed this along lateralize aspect of the fibula and secured this with bicortical screws proximally and cancellous and locking screw distally.  There was an anterior lateral piece of the tibia but it was a highly comminuted, and I could not secure this down at all except for sutures.  I went to the medial side and made an incision directly over the medial malleolus fracture piece.  I found this to be comminuted and very soft bone as well.  I was able to put 2 temporary guide pins traversing the fracture and then placed 2 partially threaded 4.0  cancellous screws over these to hold this fracture  to reduce as possible.  We chose not to fix the posterior malleolus piece, it was about 10-15% of the joint with the joint was maintained in a reduced position without fixing this piece and my concern was that with her other bones quite friable and osteopenic or soft that it would be hard to capture this piece.  I then stressed the ankle under fluoroscopy as far as the ankle mortise goes and it was stable.  We irrigated then the soft tissue wounds and curetted out all external fixator pin sites and all the pin sites were closed with 2-0 nylon suture.  We closed the deep tissue over the implants with 0  Vicryl followed by 2-0 Vicryl in subcutaneous tissue, interrupted 2-0 nylon on the skin in all incisions.  We then had her placed in posterior plaster splint with stirrups to keep her foot in a neutral position.  Tourniquet was let down .  Her toes pinked nicely.  She was awakened, extubated, and taken to recovery room in stable condition.  All final counts were correct and no complications noted.  Of note, Erskine Emery, PA-C assisted during the entire case and his assistance was crucial for facilitating this case.     Lind Guest. Ninfa Linden, M.D.     CYB/MEDQ  D:  12/12/2013  T:  12/13/2013  Job:  [id number]

## 2013-12-13 NOTE — Progress Notes (Signed)
Lyn Records to be D/C'd Home per MD order.  Discussed with the patient and all questions fully answered.    Medication List         acetaminophen-codeine 300-30 MG per tablet  Commonly known as:  TYLENOL #3  Take 1 tablet by mouth every 4 (four) hours as needed for moderate pain.     ALPRAZolam 0.25 MG tablet  Commonly known as:  XANAX  Take 0.25 mg by mouth at bedtime as needed for sleep.     aspirin-acetaminophen-caffeine 329-924-26 MG per tablet  Commonly known as:  EXCEDRIN MIGRAINE  Take 2 tablets by mouth every 6 (six) hours as needed for headache.     calcium carbonate 500 MG chewable tablet  Commonly known as:  TUMS - dosed in mg elemental calcium  Chew 1 tablet by mouth daily as needed for indigestion or heartburn.     cetirizine 10 MG tablet  Commonly known as:  ZYRTEC  Take 10 mg by mouth daily.     cyclobenzaprine 10 MG tablet  Commonly known as:  FLEXERIL  Take 10 mg by mouth 3 (three) times daily as needed for muscle spasms.     cycloSPORINE 0.05 % ophthalmic emulsion  Commonly known as:  RESTASIS  Place 1 drop into both eyes 2 (two) times daily.     docusate sodium 100 MG capsule  Commonly known as:  COLACE  Take 100 mg by mouth daily as needed for mild constipation.     enoxaparin 40 MG/0.4ML injection  Commonly known as:  LOVENOX  Inject 0.4 mLs (40 mg total) into the skin daily.     estrogens (conjugated) 0.45 MG tablet  Commonly known as:  PREMARIN  Take 0.45 mg by mouth daily. Take daily for 21 days then do not take for 7 days.     oxyCODONE-acetaminophen 5-325 MG per tablet  Commonly known as:  ROXICET  Take 1-2 tablets by mouth every 4 (four) hours as needed for severe pain.        VVS, Skin clean, dry and intact without evidence of skin break down, no evidence of skin tears noted. IV catheter discontinued intact. Site without signs and symptoms of complications. Dressing and pressure applied.  An After Visit Summary was printed and  given to the patient.  D/c education completed with patient/family including follow up instructions, medication list, d/c activities limitations if indicated, with other d/c instructions as indicated by MD - patient able to verbalize understanding, all questions fully answered.   Patient instructed to return to ED, call 911, or call MD for any changes in condition.   Patient escorted via [place], and D/C home via private auto.  Wonda Cerise D 12/13/2013 12:28 PM

## 2013-12-15 ENCOUNTER — Encounter (HOSPITAL_COMMUNITY): Payer: Self-pay | Admitting: Orthopaedic Surgery

## 2013-12-15 DIAGNOSIS — M25572 Pain in left ankle and joints of left foot: Secondary | ICD-10-CM | POA: Diagnosis not present

## 2013-12-15 DIAGNOSIS — S82852D Displaced trimalleolar fracture of left lower leg, subsequent encounter for closed fracture with routine healing: Secondary | ICD-10-CM | POA: Diagnosis not present

## 2013-12-15 DIAGNOSIS — Z9181 History of falling: Secondary | ICD-10-CM | POA: Diagnosis not present

## 2013-12-18 DIAGNOSIS — Z9181 History of falling: Secondary | ICD-10-CM | POA: Diagnosis not present

## 2013-12-18 DIAGNOSIS — M25572 Pain in left ankle and joints of left foot: Secondary | ICD-10-CM | POA: Diagnosis not present

## 2013-12-18 DIAGNOSIS — S82852D Displaced trimalleolar fracture of left lower leg, subsequent encounter for closed fracture with routine healing: Secondary | ICD-10-CM | POA: Diagnosis not present

## 2013-12-20 DIAGNOSIS — S82852D Displaced trimalleolar fracture of left lower leg, subsequent encounter for closed fracture with routine healing: Secondary | ICD-10-CM | POA: Diagnosis not present

## 2013-12-20 DIAGNOSIS — Z9181 History of falling: Secondary | ICD-10-CM | POA: Diagnosis not present

## 2013-12-20 DIAGNOSIS — M25572 Pain in left ankle and joints of left foot: Secondary | ICD-10-CM | POA: Diagnosis not present

## 2013-12-22 DIAGNOSIS — S82852D Displaced trimalleolar fracture of left lower leg, subsequent encounter for closed fracture with routine healing: Secondary | ICD-10-CM | POA: Diagnosis not present

## 2013-12-22 DIAGNOSIS — Z9181 History of falling: Secondary | ICD-10-CM | POA: Diagnosis not present

## 2013-12-22 DIAGNOSIS — M25572 Pain in left ankle and joints of left foot: Secondary | ICD-10-CM | POA: Diagnosis not present

## 2013-12-26 DIAGNOSIS — S82852D Displaced trimalleolar fracture of left lower leg, subsequent encounter for closed fracture with routine healing: Secondary | ICD-10-CM | POA: Diagnosis not present

## 2013-12-26 DIAGNOSIS — M25572 Pain in left ankle and joints of left foot: Secondary | ICD-10-CM | POA: Diagnosis not present

## 2013-12-26 DIAGNOSIS — Z9181 History of falling: Secondary | ICD-10-CM | POA: Diagnosis not present

## 2013-12-27 DIAGNOSIS — S8262XD Displaced fracture of lateral malleolus of left fibula, subsequent encounter for closed fracture with routine healing: Secondary | ICD-10-CM | POA: Diagnosis not present

## 2013-12-27 DIAGNOSIS — M25572 Pain in left ankle and joints of left foot: Secondary | ICD-10-CM | POA: Diagnosis not present

## 2013-12-28 DIAGNOSIS — S82852D Displaced trimalleolar fracture of left lower leg, subsequent encounter for closed fracture with routine healing: Secondary | ICD-10-CM | POA: Diagnosis not present

## 2013-12-28 DIAGNOSIS — M25572 Pain in left ankle and joints of left foot: Secondary | ICD-10-CM | POA: Diagnosis not present

## 2013-12-28 DIAGNOSIS — Z9181 History of falling: Secondary | ICD-10-CM | POA: Diagnosis not present

## 2014-01-01 DIAGNOSIS — S82852D Displaced trimalleolar fracture of left lower leg, subsequent encounter for closed fracture with routine healing: Secondary | ICD-10-CM | POA: Diagnosis not present

## 2014-01-01 DIAGNOSIS — Z9181 History of falling: Secondary | ICD-10-CM | POA: Diagnosis not present

## 2014-01-01 DIAGNOSIS — M25572 Pain in left ankle and joints of left foot: Secondary | ICD-10-CM | POA: Diagnosis not present

## 2014-01-03 DIAGNOSIS — M25572 Pain in left ankle and joints of left foot: Secondary | ICD-10-CM | POA: Diagnosis not present

## 2014-01-03 DIAGNOSIS — Z9181 History of falling: Secondary | ICD-10-CM | POA: Diagnosis not present

## 2014-01-03 DIAGNOSIS — S82852D Displaced trimalleolar fracture of left lower leg, subsequent encounter for closed fracture with routine healing: Secondary | ICD-10-CM | POA: Diagnosis not present

## 2014-01-08 DIAGNOSIS — S82852D Displaced trimalleolar fracture of left lower leg, subsequent encounter for closed fracture with routine healing: Secondary | ICD-10-CM | POA: Diagnosis not present

## 2014-01-08 DIAGNOSIS — Z9181 History of falling: Secondary | ICD-10-CM | POA: Diagnosis not present

## 2014-01-08 DIAGNOSIS — M25572 Pain in left ankle and joints of left foot: Secondary | ICD-10-CM | POA: Diagnosis not present

## 2014-01-16 DIAGNOSIS — S82852D Displaced trimalleolar fracture of left lower leg, subsequent encounter for closed fracture with routine healing: Secondary | ICD-10-CM | POA: Diagnosis not present

## 2014-01-16 DIAGNOSIS — M25572 Pain in left ankle and joints of left foot: Secondary | ICD-10-CM | POA: Diagnosis not present

## 2014-01-16 DIAGNOSIS — Z9181 History of falling: Secondary | ICD-10-CM | POA: Diagnosis not present

## 2014-01-17 DIAGNOSIS — M25572 Pain in left ankle and joints of left foot: Secondary | ICD-10-CM | POA: Diagnosis not present

## 2014-01-19 DIAGNOSIS — S82852D Displaced trimalleolar fracture of left lower leg, subsequent encounter for closed fracture with routine healing: Secondary | ICD-10-CM | POA: Diagnosis not present

## 2014-01-19 DIAGNOSIS — Z9181 History of falling: Secondary | ICD-10-CM | POA: Diagnosis not present

## 2014-01-19 DIAGNOSIS — M25572 Pain in left ankle and joints of left foot: Secondary | ICD-10-CM | POA: Diagnosis not present

## 2014-01-22 DIAGNOSIS — Z9181 History of falling: Secondary | ICD-10-CM | POA: Diagnosis not present

## 2014-01-22 DIAGNOSIS — M25572 Pain in left ankle and joints of left foot: Secondary | ICD-10-CM | POA: Diagnosis not present

## 2014-01-22 DIAGNOSIS — S82852D Displaced trimalleolar fracture of left lower leg, subsequent encounter for closed fracture with routine healing: Secondary | ICD-10-CM | POA: Diagnosis not present

## 2014-01-24 DIAGNOSIS — S82852D Displaced trimalleolar fracture of left lower leg, subsequent encounter for closed fracture with routine healing: Secondary | ICD-10-CM | POA: Diagnosis not present

## 2014-01-24 DIAGNOSIS — M25572 Pain in left ankle and joints of left foot: Secondary | ICD-10-CM | POA: Diagnosis not present

## 2014-01-24 DIAGNOSIS — Z9181 History of falling: Secondary | ICD-10-CM | POA: Diagnosis not present

## 2014-01-26 DIAGNOSIS — S82852D Displaced trimalleolar fracture of left lower leg, subsequent encounter for closed fracture with routine healing: Secondary | ICD-10-CM | POA: Diagnosis not present

## 2014-01-26 DIAGNOSIS — Z9181 History of falling: Secondary | ICD-10-CM | POA: Diagnosis not present

## 2014-01-26 DIAGNOSIS — M25572 Pain in left ankle and joints of left foot: Secondary | ICD-10-CM | POA: Diagnosis not present

## 2014-01-29 DIAGNOSIS — Z9181 History of falling: Secondary | ICD-10-CM | POA: Diagnosis not present

## 2014-01-29 DIAGNOSIS — M25572 Pain in left ankle and joints of left foot: Secondary | ICD-10-CM | POA: Diagnosis not present

## 2014-01-29 DIAGNOSIS — S82852D Displaced trimalleolar fracture of left lower leg, subsequent encounter for closed fracture with routine healing: Secondary | ICD-10-CM | POA: Diagnosis not present

## 2014-02-01 DIAGNOSIS — M25572 Pain in left ankle and joints of left foot: Secondary | ICD-10-CM | POA: Diagnosis not present

## 2014-02-01 DIAGNOSIS — Z9181 History of falling: Secondary | ICD-10-CM | POA: Diagnosis not present

## 2014-02-01 DIAGNOSIS — S82852D Displaced trimalleolar fracture of left lower leg, subsequent encounter for closed fracture with routine healing: Secondary | ICD-10-CM | POA: Diagnosis not present

## 2014-02-02 DIAGNOSIS — S82852D Displaced trimalleolar fracture of left lower leg, subsequent encounter for closed fracture with routine healing: Secondary | ICD-10-CM | POA: Diagnosis not present

## 2014-02-02 DIAGNOSIS — Z9181 History of falling: Secondary | ICD-10-CM | POA: Diagnosis not present

## 2014-02-02 DIAGNOSIS — M25572 Pain in left ankle and joints of left foot: Secondary | ICD-10-CM | POA: Diagnosis not present

## 2014-02-05 DIAGNOSIS — M25572 Pain in left ankle and joints of left foot: Secondary | ICD-10-CM | POA: Diagnosis not present

## 2014-02-05 DIAGNOSIS — S82852D Displaced trimalleolar fracture of left lower leg, subsequent encounter for closed fracture with routine healing: Secondary | ICD-10-CM | POA: Diagnosis not present

## 2014-02-05 DIAGNOSIS — Z9181 History of falling: Secondary | ICD-10-CM | POA: Diagnosis not present

## 2014-02-07 DIAGNOSIS — S82852D Displaced trimalleolar fracture of left lower leg, subsequent encounter for closed fracture with routine healing: Secondary | ICD-10-CM | POA: Diagnosis not present

## 2014-02-07 DIAGNOSIS — Z9181 History of falling: Secondary | ICD-10-CM | POA: Diagnosis not present

## 2014-02-07 DIAGNOSIS — M25572 Pain in left ankle and joints of left foot: Secondary | ICD-10-CM | POA: Diagnosis not present

## 2014-02-09 DIAGNOSIS — Z9181 History of falling: Secondary | ICD-10-CM | POA: Diagnosis not present

## 2014-02-09 DIAGNOSIS — S82852D Displaced trimalleolar fracture of left lower leg, subsequent encounter for closed fracture with routine healing: Secondary | ICD-10-CM | POA: Diagnosis not present

## 2014-02-09 DIAGNOSIS — M25572 Pain in left ankle and joints of left foot: Secondary | ICD-10-CM | POA: Diagnosis not present

## 2014-02-11 DIAGNOSIS — Z9181 History of falling: Secondary | ICD-10-CM | POA: Diagnosis not present

## 2014-02-11 DIAGNOSIS — M25572 Pain in left ankle and joints of left foot: Secondary | ICD-10-CM | POA: Diagnosis not present

## 2014-02-11 DIAGNOSIS — S82852D Displaced trimalleolar fracture of left lower leg, subsequent encounter for closed fracture with routine healing: Secondary | ICD-10-CM | POA: Diagnosis not present

## 2014-02-13 DIAGNOSIS — S82852D Displaced trimalleolar fracture of left lower leg, subsequent encounter for closed fracture with routine healing: Secondary | ICD-10-CM | POA: Diagnosis not present

## 2014-02-13 DIAGNOSIS — Z9181 History of falling: Secondary | ICD-10-CM | POA: Diagnosis not present

## 2014-02-13 DIAGNOSIS — M25572 Pain in left ankle and joints of left foot: Secondary | ICD-10-CM | POA: Diagnosis not present

## 2014-02-14 DIAGNOSIS — Z9181 History of falling: Secondary | ICD-10-CM | POA: Diagnosis not present

## 2014-02-14 DIAGNOSIS — S82852D Displaced trimalleolar fracture of left lower leg, subsequent encounter for closed fracture with routine healing: Secondary | ICD-10-CM | POA: Diagnosis not present

## 2014-02-14 DIAGNOSIS — M25572 Pain in left ankle and joints of left foot: Secondary | ICD-10-CM | POA: Diagnosis not present

## 2014-02-19 DIAGNOSIS — Z9181 History of falling: Secondary | ICD-10-CM | POA: Diagnosis not present

## 2014-02-19 DIAGNOSIS — S82852D Displaced trimalleolar fracture of left lower leg, subsequent encounter for closed fracture with routine healing: Secondary | ICD-10-CM | POA: Diagnosis not present

## 2014-02-19 DIAGNOSIS — M25572 Pain in left ankle and joints of left foot: Secondary | ICD-10-CM | POA: Diagnosis not present

## 2014-02-21 ENCOUNTER — Ambulatory Visit: Payer: Medicare Other | Attending: Orthopaedic Surgery

## 2014-02-21 DIAGNOSIS — X58XXXS Exposure to other specified factors, sequela: Secondary | ICD-10-CM | POA: Insufficient documentation

## 2014-02-21 DIAGNOSIS — S98012S Complete traumatic amputation of left foot at ankle level, sequela: Secondary | ICD-10-CM | POA: Diagnosis not present

## 2014-02-28 ENCOUNTER — Ambulatory Visit: Payer: Medicare Other

## 2014-02-28 DIAGNOSIS — S98012S Complete traumatic amputation of left foot at ankle level, sequela: Secondary | ICD-10-CM | POA: Diagnosis not present

## 2014-03-06 ENCOUNTER — Ambulatory Visit: Payer: Medicare Other | Admitting: Rehabilitation

## 2014-03-06 DIAGNOSIS — S98012S Complete traumatic amputation of left foot at ankle level, sequela: Secondary | ICD-10-CM | POA: Diagnosis not present

## 2014-03-07 ENCOUNTER — Ambulatory Visit: Payer: Medicare Other

## 2014-03-07 DIAGNOSIS — S98012S Complete traumatic amputation of left foot at ankle level, sequela: Secondary | ICD-10-CM | POA: Diagnosis not present

## 2014-03-12 ENCOUNTER — Ambulatory Visit: Payer: Medicare Other | Admitting: Rehabilitation

## 2014-03-12 DIAGNOSIS — S98012S Complete traumatic amputation of left foot at ankle level, sequela: Secondary | ICD-10-CM | POA: Diagnosis not present

## 2014-03-14 ENCOUNTER — Ambulatory Visit: Payer: Medicare Other

## 2014-03-14 DIAGNOSIS — S98012S Complete traumatic amputation of left foot at ankle level, sequela: Secondary | ICD-10-CM | POA: Diagnosis not present

## 2014-03-19 ENCOUNTER — Ambulatory Visit: Payer: Medicare Other | Admitting: Rehabilitation

## 2014-03-19 DIAGNOSIS — S98012S Complete traumatic amputation of left foot at ankle level, sequela: Secondary | ICD-10-CM | POA: Diagnosis not present

## 2014-03-21 ENCOUNTER — Ambulatory Visit: Payer: Medicare Other

## 2014-03-21 DIAGNOSIS — S98012S Complete traumatic amputation of left foot at ankle level, sequela: Secondary | ICD-10-CM | POA: Diagnosis not present

## 2014-03-26 ENCOUNTER — Ambulatory Visit: Payer: Medicare Other | Admitting: Rehabilitation

## 2014-03-28 ENCOUNTER — Ambulatory Visit: Payer: Medicare Other | Attending: Orthopaedic Surgery | Admitting: Rehabilitation

## 2014-03-28 DIAGNOSIS — M25572 Pain in left ankle and joints of left foot: Secondary | ICD-10-CM | POA: Insufficient documentation

## 2014-03-28 DIAGNOSIS — M25672 Stiffness of left ankle, not elsewhere classified: Secondary | ICD-10-CM | POA: Insufficient documentation

## 2014-03-28 DIAGNOSIS — S8262XD Displaced fracture of lateral malleolus of left fibula, subsequent encounter for closed fracture with routine healing: Secondary | ICD-10-CM | POA: Diagnosis not present

## 2014-04-03 ENCOUNTER — Ambulatory Visit: Payer: Medicare Other | Admitting: Rehabilitation

## 2014-04-03 DIAGNOSIS — M25572 Pain in left ankle and joints of left foot: Secondary | ICD-10-CM | POA: Diagnosis not present

## 2014-04-03 DIAGNOSIS — M25672 Stiffness of left ankle, not elsewhere classified: Secondary | ICD-10-CM | POA: Diagnosis not present

## 2014-04-05 ENCOUNTER — Ambulatory Visit: Payer: Medicare Other | Admitting: Rehabilitation

## 2014-04-05 DIAGNOSIS — M25672 Stiffness of left ankle, not elsewhere classified: Secondary | ICD-10-CM | POA: Diagnosis not present

## 2014-04-05 DIAGNOSIS — M25572 Pain in left ankle and joints of left foot: Secondary | ICD-10-CM | POA: Diagnosis not present

## 2014-04-10 ENCOUNTER — Ambulatory Visit: Payer: Medicare Other | Admitting: Rehabilitation

## 2014-04-10 DIAGNOSIS — M25572 Pain in left ankle and joints of left foot: Secondary | ICD-10-CM | POA: Diagnosis not present

## 2014-04-10 DIAGNOSIS — M25672 Stiffness of left ankle, not elsewhere classified: Secondary | ICD-10-CM | POA: Diagnosis not present

## 2014-04-11 ENCOUNTER — Ambulatory Visit: Payer: Medicare Other

## 2014-04-11 DIAGNOSIS — M25672 Stiffness of left ankle, not elsewhere classified: Secondary | ICD-10-CM | POA: Diagnosis not present

## 2014-04-11 DIAGNOSIS — M25572 Pain in left ankle and joints of left foot: Secondary | ICD-10-CM | POA: Diagnosis not present

## 2014-04-16 DIAGNOSIS — H5203 Hypermetropia, bilateral: Secondary | ICD-10-CM | POA: Diagnosis not present

## 2014-04-16 DIAGNOSIS — H3531 Nonexudative age-related macular degeneration: Secondary | ICD-10-CM | POA: Diagnosis not present

## 2014-04-17 ENCOUNTER — Ambulatory Visit: Payer: Medicare Other | Admitting: Rehabilitation

## 2014-04-17 DIAGNOSIS — M25572 Pain in left ankle and joints of left foot: Secondary | ICD-10-CM | POA: Diagnosis not present

## 2014-04-17 DIAGNOSIS — M25672 Stiffness of left ankle, not elsewhere classified: Secondary | ICD-10-CM | POA: Diagnosis not present

## 2014-04-19 ENCOUNTER — Ambulatory Visit: Payer: Medicare Other | Admitting: Rehabilitation

## 2014-04-19 DIAGNOSIS — M25672 Stiffness of left ankle, not elsewhere classified: Secondary | ICD-10-CM | POA: Diagnosis not present

## 2014-04-19 DIAGNOSIS — M25572 Pain in left ankle and joints of left foot: Secondary | ICD-10-CM | POA: Diagnosis not present

## 2014-04-23 ENCOUNTER — Ambulatory Visit: Payer: Medicare Other | Attending: Orthopaedic Surgery | Admitting: Rehabilitation

## 2014-04-23 DIAGNOSIS — M25672 Stiffness of left ankle, not elsewhere classified: Secondary | ICD-10-CM | POA: Insufficient documentation

## 2014-04-23 DIAGNOSIS — M25572 Pain in left ankle and joints of left foot: Secondary | ICD-10-CM | POA: Insufficient documentation

## 2014-04-24 DIAGNOSIS — M545 Low back pain: Secondary | ICD-10-CM | POA: Diagnosis not present

## 2014-04-24 DIAGNOSIS — M9904 Segmental and somatic dysfunction of sacral region: Secondary | ICD-10-CM | POA: Diagnosis not present

## 2014-04-25 ENCOUNTER — Ambulatory Visit: Payer: Medicare Other

## 2014-04-30 ENCOUNTER — Ambulatory Visit: Payer: Medicare Other | Admitting: Rehabilitation

## 2014-04-30 DIAGNOSIS — M25572 Pain in left ankle and joints of left foot: Secondary | ICD-10-CM | POA: Diagnosis not present

## 2014-04-30 DIAGNOSIS — M25672 Stiffness of left ankle, not elsewhere classified: Secondary | ICD-10-CM | POA: Diagnosis not present

## 2014-05-03 ENCOUNTER — Ambulatory Visit: Payer: Medicare Other | Admitting: Rehabilitation

## 2014-05-03 DIAGNOSIS — M25572 Pain in left ankle and joints of left foot: Secondary | ICD-10-CM | POA: Diagnosis not present

## 2014-05-03 DIAGNOSIS — M25672 Stiffness of left ankle, not elsewhere classified: Secondary | ICD-10-CM | POA: Diagnosis not present

## 2014-05-07 ENCOUNTER — Ambulatory Visit: Payer: Medicare Other | Admitting: Rehabilitation

## 2014-05-07 DIAGNOSIS — M25672 Stiffness of left ankle, not elsewhere classified: Secondary | ICD-10-CM | POA: Diagnosis not present

## 2014-05-07 DIAGNOSIS — M25572 Pain in left ankle and joints of left foot: Secondary | ICD-10-CM | POA: Diagnosis not present

## 2014-05-09 ENCOUNTER — Ambulatory Visit: Payer: Medicare Other | Admitting: Physical Therapy

## 2014-05-09 DIAGNOSIS — M25672 Stiffness of left ankle, not elsewhere classified: Secondary | ICD-10-CM | POA: Diagnosis not present

## 2014-05-09 DIAGNOSIS — M25572 Pain in left ankle and joints of left foot: Secondary | ICD-10-CM
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
## 2014-05-14 ENCOUNTER — Ambulatory Visit: Payer: Medicare Other | Admitting: Rehabilitation

## 2014-05-16 ENCOUNTER — Encounter: Payer: Self-pay | Admitting: Physical Therapy

## 2014-05-16 ENCOUNTER — Ambulatory Visit: Payer: Medicare Other

## 2014-05-16 NOTE — Therapy (Signed)
[facility] [place] [facility] [address]  [address] [place], [place], [address] Phone: [phone]   Fax:  [phone]  Patient Details  Name: Madison Wall MRN: [id number] Date of Birth: 02/02/1941 Referring Provider:  No ref. provider found  Encounter Date: 05/16/2014   PHYSICAL THERAPY DISCHARGE SUMMARY  Visits from Start of Care: 21  Current functional level related to goals / functional outcomes: PT LONG TERM GOAL #1     Title  verbalize understanding of RICE to reduce risk of reinjury (05/17/14)    Status  Met    PT LONG TERM GOAL #2    Title  independent with advanced HEP (05/17/14)    Status  Met    PT LONG TERM GOAL #3    Title  increase ROM by 5 degrees DF/PF for improved function (05/17/14)    Status  Unable to Assess    PT LONG TERM GOAL #4    Title  negotiate stairs reciprocally without device with supervision (05/17/14)    Status  Not Met; pt able to negotiate with rail mod I    PT LONG TERM GOAL #5    Title  perform LLE single limb stance > 5 sec without UE support for improved function (05/17/14)    Status  Not Met; 3 sec max    PT LONG TERM GOAL #6    Title  report return to yoga without increase in pain (05/17/14)    Status  Unable to Assess        FOTO: 46 (54% limited)   Remaining deficits: Pt continues to demonstrate decreased single limb stance and difficulty negotiating stairs without device.  Pt demonstrates some mild functional limitations but overall has returned to independent ambulation with occasional pain.   Education / Equipment: HEP, RICE  Plan: Patient agrees to discharge.  Patient goals were partially met. Patient is being discharged due to the patient's request.  ?????   Pt sick for last week of appts and requested d/c therefore unable to formally assess all goals.    Stephanie F Matthews, PT, DPT 05/16/2014 9:25 AM   [facility] at  [facility] [place] [facility] [address] [place], [place] [address]  [phone] (office) [phone] (fax)         Matthews, Stephanie K 05/16/2014, 9:20 AM  [facility] [place] [facility] [address]  [address] [place], [place], [address] Phone: [phone]   Fax:  [phone] 

## 2014-05-23 DIAGNOSIS — M9904 Segmental and somatic dysfunction of sacral region: Secondary | ICD-10-CM | POA: Diagnosis not present

## 2014-05-23 DIAGNOSIS — M545 Low back pain: Secondary | ICD-10-CM | POA: Diagnosis not present

## 2014-06-25 DIAGNOSIS — M545 Low back pain: Secondary | ICD-10-CM | POA: Diagnosis not present

## 2014-06-25 DIAGNOSIS — M9904 Segmental and somatic dysfunction of sacral region: Secondary | ICD-10-CM | POA: Diagnosis not present

## 2014-07-05 DIAGNOSIS — M9904 Segmental and somatic dysfunction of sacral region: Secondary | ICD-10-CM | POA: Diagnosis not present

## 2014-07-05 DIAGNOSIS — M545 Low back pain: Secondary | ICD-10-CM | POA: Diagnosis not present

## 2014-07-11 DIAGNOSIS — F418 Other specified anxiety disorders: Secondary | ICD-10-CM | POA: Diagnosis not present

## 2014-07-11 DIAGNOSIS — Z833 Family history of diabetes mellitus: Secondary | ICD-10-CM | POA: Diagnosis not present

## 2014-07-11 DIAGNOSIS — Z8719 Personal history of other diseases of the digestive system: Secondary | ICD-10-CM | POA: Diagnosis not present

## 2014-07-11 DIAGNOSIS — Z1389 Encounter for screening for other disorder: Secondary | ICD-10-CM | POA: Diagnosis not present

## 2014-07-11 DIAGNOSIS — M8580 Other specified disorders of bone density and structure, unspecified site: Secondary | ICD-10-CM | POA: Diagnosis not present

## 2014-07-11 DIAGNOSIS — R635 Abnormal weight gain: Secondary | ICD-10-CM | POA: Diagnosis not present

## 2014-07-11 DIAGNOSIS — Z0000 Encounter for general adult medical examination without abnormal findings: Secondary | ICD-10-CM | POA: Diagnosis not present

## 2014-07-11 DIAGNOSIS — G4733 Obstructive sleep apnea (adult) (pediatric): Secondary | ICD-10-CM | POA: Diagnosis not present

## 2014-07-11 DIAGNOSIS — E559 Vitamin D deficiency, unspecified: Secondary | ICD-10-CM | POA: Diagnosis not present

## 2014-07-27 DIAGNOSIS — M545 Low back pain: Secondary | ICD-10-CM | POA: Diagnosis not present

## 2014-07-27 DIAGNOSIS — M9904 Segmental and somatic dysfunction of sacral region: Secondary | ICD-10-CM | POA: Diagnosis not present

## 2014-08-16 DIAGNOSIS — M545 Low back pain: Secondary | ICD-10-CM | POA: Diagnosis not present

## 2014-08-16 DIAGNOSIS — M9904 Segmental and somatic dysfunction of sacral region: Secondary | ICD-10-CM | POA: Diagnosis not present

## 2014-09-18 ENCOUNTER — Other Ambulatory Visit: Payer: Self-pay | Admitting: Obstetrics & Gynecology

## 2014-09-18 DIAGNOSIS — Z1272 Encounter for screening for malignant neoplasm of vagina: Secondary | ICD-10-CM | POA: Diagnosis not present

## 2014-09-18 DIAGNOSIS — Z124 Encounter for screening for malignant neoplasm of cervix: Secondary | ICD-10-CM | POA: Diagnosis not present

## 2014-09-18 DIAGNOSIS — Z01419 Encounter for gynecological examination (general) (routine) without abnormal findings: Secondary | ICD-10-CM | POA: Diagnosis not present

## 2014-09-18 DIAGNOSIS — Z1231 Encounter for screening mammogram for malignant neoplasm of breast: Secondary | ICD-10-CM | POA: Diagnosis not present

## 2014-09-18 DIAGNOSIS — Z6826 Body mass index (BMI) 26.0-26.9, adult: Secondary | ICD-10-CM | POA: Diagnosis not present

## 2014-09-19 LAB — CYTOLOGY - PAP

## 2014-09-25 ENCOUNTER — Other Ambulatory Visit: Payer: Self-pay | Admitting: Obstetrics & Gynecology

## 2014-09-25 DIAGNOSIS — R928 Other abnormal and inconclusive findings on diagnostic imaging of breast: Secondary | ICD-10-CM

## 2014-10-09 DIAGNOSIS — M545 Low back pain: Secondary | ICD-10-CM | POA: Diagnosis not present

## 2014-10-09 DIAGNOSIS — M9904 Segmental and somatic dysfunction of sacral region: Secondary | ICD-10-CM | POA: Diagnosis not present

## 2014-10-16 ENCOUNTER — Ambulatory Visit
Admission: RE | Admit: 2014-10-16 | Discharge: 2014-10-16 | Disposition: A | Discharge status: Home / Self Care | Payer: Medicare Other | Source: Ambulatory Visit | Attending: Obstetrics & Gynecology | Admitting: Obstetrics & Gynecology

## 2014-10-16 ENCOUNTER — Ambulatory Visit
Admission: RE | Admit: 2014-10-16 | Discharge: 2014-10-16 | Disposition: A | Discharge status: Home / Self Care | Payer: BLUE CROSS/BLUE SHIELD | Source: Ambulatory Visit | Attending: Obstetrics & Gynecology | Admitting: Obstetrics & Gynecology

## 2014-10-16 DIAGNOSIS — N6002 Solitary cyst of left breast: Secondary | ICD-10-CM | POA: Diagnosis not present

## 2014-10-16 DIAGNOSIS — R928 Other abnormal and inconclusive findings on diagnostic imaging of breast: Secondary | ICD-10-CM

## 2014-10-22 DIAGNOSIS — M545 Low back pain: Secondary | ICD-10-CM | POA: Diagnosis not present

## 2014-10-22 DIAGNOSIS — M9904 Segmental and somatic dysfunction of sacral region: Secondary | ICD-10-CM | POA: Diagnosis not present

## 2014-10-26 DIAGNOSIS — M545 Low back pain: Secondary | ICD-10-CM | POA: Diagnosis not present

## 2014-10-26 DIAGNOSIS — M9904 Segmental and somatic dysfunction of sacral region: Secondary | ICD-10-CM | POA: Diagnosis not present

## 2014-11-13 DIAGNOSIS — M545 Low back pain: Secondary | ICD-10-CM | POA: Diagnosis not present

## 2014-11-13 DIAGNOSIS — M9904 Segmental and somatic dysfunction of sacral region: Secondary | ICD-10-CM | POA: Diagnosis not present

## 2014-12-31 DIAGNOSIS — M9904 Segmental and somatic dysfunction of sacral region: Secondary | ICD-10-CM | POA: Diagnosis not present

## 2014-12-31 DIAGNOSIS — M545 Low back pain: Secondary | ICD-10-CM | POA: Diagnosis not present

## 2015-01-03 DIAGNOSIS — M545 Low back pain: Secondary | ICD-10-CM | POA: Diagnosis not present

## 2015-01-03 DIAGNOSIS — M9904 Segmental and somatic dysfunction of sacral region: Secondary | ICD-10-CM | POA: Diagnosis not present

## 2015-01-18 DIAGNOSIS — M545 Low back pain: Secondary | ICD-10-CM | POA: Diagnosis not present

## 2015-01-18 DIAGNOSIS — M9904 Segmental and somatic dysfunction of sacral region: Secondary | ICD-10-CM | POA: Diagnosis not present

## 2015-02-20 DIAGNOSIS — M545 Low back pain: Secondary | ICD-10-CM | POA: Diagnosis not present

## 2015-02-20 DIAGNOSIS — M9904 Segmental and somatic dysfunction of sacral region: Secondary | ICD-10-CM | POA: Diagnosis not present

## 2015-02-23 DIAGNOSIS — T3695XA Adverse effect of unspecified systemic antibiotic, initial encounter: Secondary | ICD-10-CM | POA: Diagnosis not present

## 2015-02-23 DIAGNOSIS — B379 Candidiasis, unspecified: Secondary | ICD-10-CM | POA: Diagnosis not present

## 2015-02-23 DIAGNOSIS — J0100 Acute maxillary sinusitis, unspecified: Secondary | ICD-10-CM | POA: Diagnosis not present

## 2015-02-25 DIAGNOSIS — M9904 Segmental and somatic dysfunction of sacral region: Secondary | ICD-10-CM | POA: Diagnosis not present

## 2015-02-25 DIAGNOSIS — M545 Low back pain: Secondary | ICD-10-CM | POA: Diagnosis not present

## 2015-03-27 DIAGNOSIS — M545 Low back pain: Secondary | ICD-10-CM | POA: Diagnosis not present

## 2015-03-27 DIAGNOSIS — M9904 Segmental and somatic dysfunction of sacral region: Secondary | ICD-10-CM | POA: Diagnosis not present

## 2015-04-04 DIAGNOSIS — M545 Low back pain: Secondary | ICD-10-CM | POA: Diagnosis not present

## 2015-04-04 DIAGNOSIS — M9904 Segmental and somatic dysfunction of sacral region: Secondary | ICD-10-CM | POA: Diagnosis not present

## 2015-04-24 DIAGNOSIS — M9904 Segmental and somatic dysfunction of sacral region: Secondary | ICD-10-CM | POA: Diagnosis not present

## 2015-04-24 DIAGNOSIS — M545 Low back pain: Secondary | ICD-10-CM | POA: Diagnosis not present

## 2015-05-15 DIAGNOSIS — M545 Low back pain: Secondary | ICD-10-CM | POA: Diagnosis not present

## 2015-05-15 DIAGNOSIS — M9904 Segmental and somatic dysfunction of sacral region: Secondary | ICD-10-CM | POA: Diagnosis not present

## 2015-05-17 DIAGNOSIS — H524 Presbyopia: Secondary | ICD-10-CM | POA: Diagnosis not present

## 2015-05-17 DIAGNOSIS — H353131 Nonexudative age-related macular degeneration, bilateral, early dry stage: Secondary | ICD-10-CM | POA: Diagnosis not present

## 2015-05-28 DIAGNOSIS — M9904 Segmental and somatic dysfunction of sacral region: Secondary | ICD-10-CM | POA: Diagnosis not present

## 2015-05-28 DIAGNOSIS — M545 Low back pain: Secondary | ICD-10-CM | POA: Diagnosis not present

## 2015-06-13 DIAGNOSIS — M9904 Segmental and somatic dysfunction of sacral region: Secondary | ICD-10-CM | POA: Diagnosis not present

## 2015-06-13 DIAGNOSIS — M545 Low back pain: Secondary | ICD-10-CM | POA: Diagnosis not present

## 2015-07-16 DIAGNOSIS — M545 Low back pain: Secondary | ICD-10-CM | POA: Diagnosis not present

## 2015-07-16 DIAGNOSIS — M9904 Segmental and somatic dysfunction of sacral region: Secondary | ICD-10-CM | POA: Diagnosis not present

## 2015-08-05 DIAGNOSIS — M9904 Segmental and somatic dysfunction of sacral region: Secondary | ICD-10-CM | POA: Diagnosis not present

## 2015-08-05 DIAGNOSIS — M545 Low back pain: Secondary | ICD-10-CM | POA: Diagnosis not present

## 2015-08-11 DIAGNOSIS — B379 Candidiasis, unspecified: Secondary | ICD-10-CM | POA: Diagnosis not present

## 2015-08-11 DIAGNOSIS — J0140 Acute pansinusitis, unspecified: Secondary | ICD-10-CM | POA: Diagnosis not present

## 2015-08-11 DIAGNOSIS — T3695XA Adverse effect of unspecified systemic antibiotic, initial encounter: Secondary | ICD-10-CM | POA: Diagnosis not present

## 2015-09-05 DIAGNOSIS — M545 Low back pain: Secondary | ICD-10-CM | POA: Diagnosis not present

## 2015-09-05 DIAGNOSIS — M9904 Segmental and somatic dysfunction of sacral region: Secondary | ICD-10-CM | POA: Diagnosis not present

## 2015-09-12 DIAGNOSIS — M545 Low back pain: Secondary | ICD-10-CM | POA: Diagnosis not present

## 2015-09-12 DIAGNOSIS — M9904 Segmental and somatic dysfunction of sacral region: Secondary | ICD-10-CM | POA: Diagnosis not present

## 2015-09-19 DIAGNOSIS — M545 Low back pain: Secondary | ICD-10-CM | POA: Diagnosis not present

## 2015-09-19 DIAGNOSIS — M9904 Segmental and somatic dysfunction of sacral region: Secondary | ICD-10-CM | POA: Diagnosis not present

## 2015-09-30 DIAGNOSIS — M9904 Segmental and somatic dysfunction of sacral region: Secondary | ICD-10-CM | POA: Diagnosis not present

## 2015-09-30 DIAGNOSIS — M545 Low back pain: Secondary | ICD-10-CM | POA: Diagnosis not present

## 2015-10-07 DIAGNOSIS — N958 Other specified menopausal and perimenopausal disorders: Secondary | ICD-10-CM | POA: Diagnosis not present

## 2015-10-07 DIAGNOSIS — R350 Frequency of micturition: Secondary | ICD-10-CM | POA: Diagnosis not present

## 2015-10-07 DIAGNOSIS — Z01419 Encounter for gynecological examination (general) (routine) without abnormal findings: Secondary | ICD-10-CM | POA: Diagnosis not present

## 2015-10-07 DIAGNOSIS — R351 Nocturia: Secondary | ICD-10-CM | POA: Diagnosis not present

## 2015-10-07 DIAGNOSIS — M816 Localized osteoporosis [Lequesne]: Secondary | ICD-10-CM | POA: Diagnosis not present

## 2015-10-07 DIAGNOSIS — Z6826 Body mass index (BMI) 26.0-26.9, adult: Secondary | ICD-10-CM | POA: Diagnosis not present

## 2015-10-07 DIAGNOSIS — Z1231 Encounter for screening mammogram for malignant neoplasm of breast: Secondary | ICD-10-CM | POA: Diagnosis not present

## 2015-10-07 DIAGNOSIS — N760 Acute vaginitis: Secondary | ICD-10-CM | POA: Diagnosis not present

## 2015-10-24 DIAGNOSIS — M545 Low back pain: Secondary | ICD-10-CM | POA: Diagnosis not present

## 2015-10-24 DIAGNOSIS — M9904 Segmental and somatic dysfunction of sacral region: Secondary | ICD-10-CM | POA: Diagnosis not present

## 2015-11-20 DIAGNOSIS — M9904 Segmental and somatic dysfunction of sacral region: Secondary | ICD-10-CM | POA: Diagnosis not present

## 2015-11-20 DIAGNOSIS — M545 Low back pain: Secondary | ICD-10-CM | POA: Diagnosis not present

## 2015-12-12 DIAGNOSIS — M545 Low back pain: Secondary | ICD-10-CM | POA: Diagnosis not present

## 2015-12-12 DIAGNOSIS — M9904 Segmental and somatic dysfunction of sacral region: Secondary | ICD-10-CM | POA: Diagnosis not present
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
## 2015-12-20 IMAGING — CR DG ANKLE PORT 2V*L*
2 series · 2 of 2 positions shown · non-contrast
Comparison: Earlier films, same date.

CLINICAL DATA: Attempted reduction of dislocation.

EXAM:
PORTABLE LEFT ANKLE - 2 VIEW

[AP]
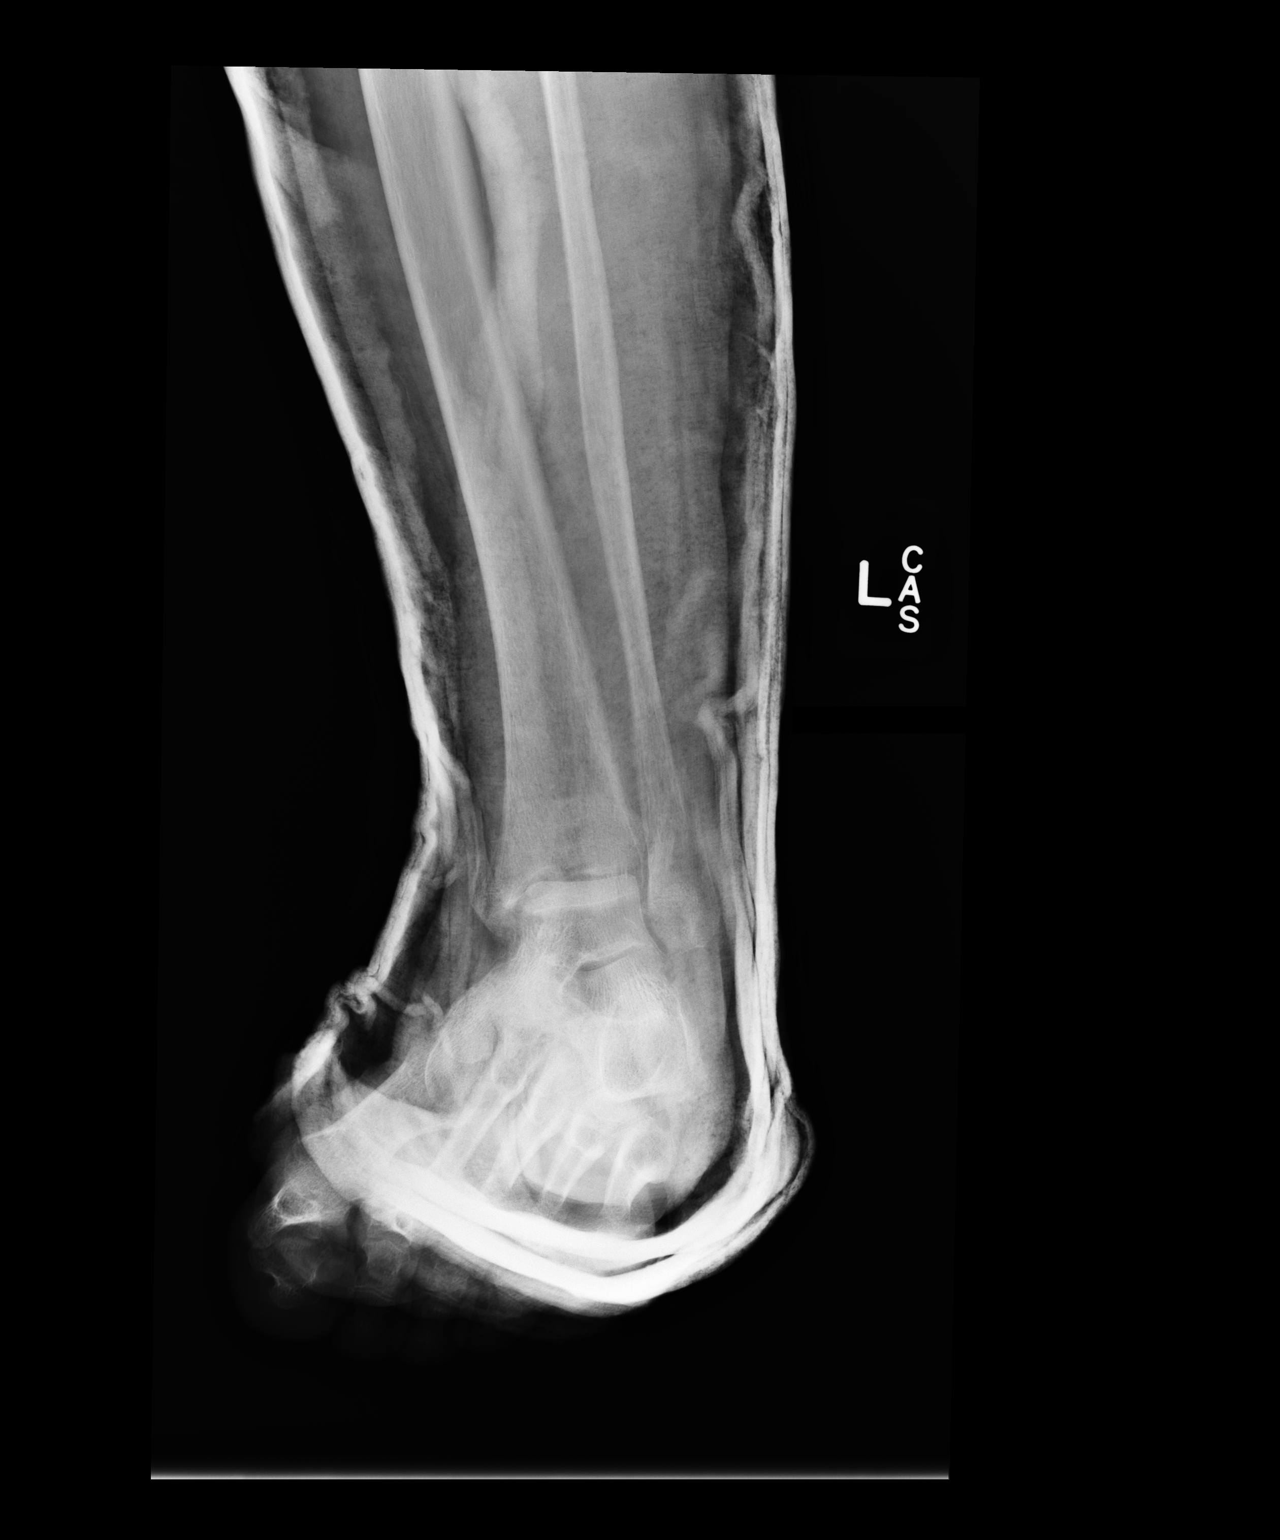

[ap obl int rot]
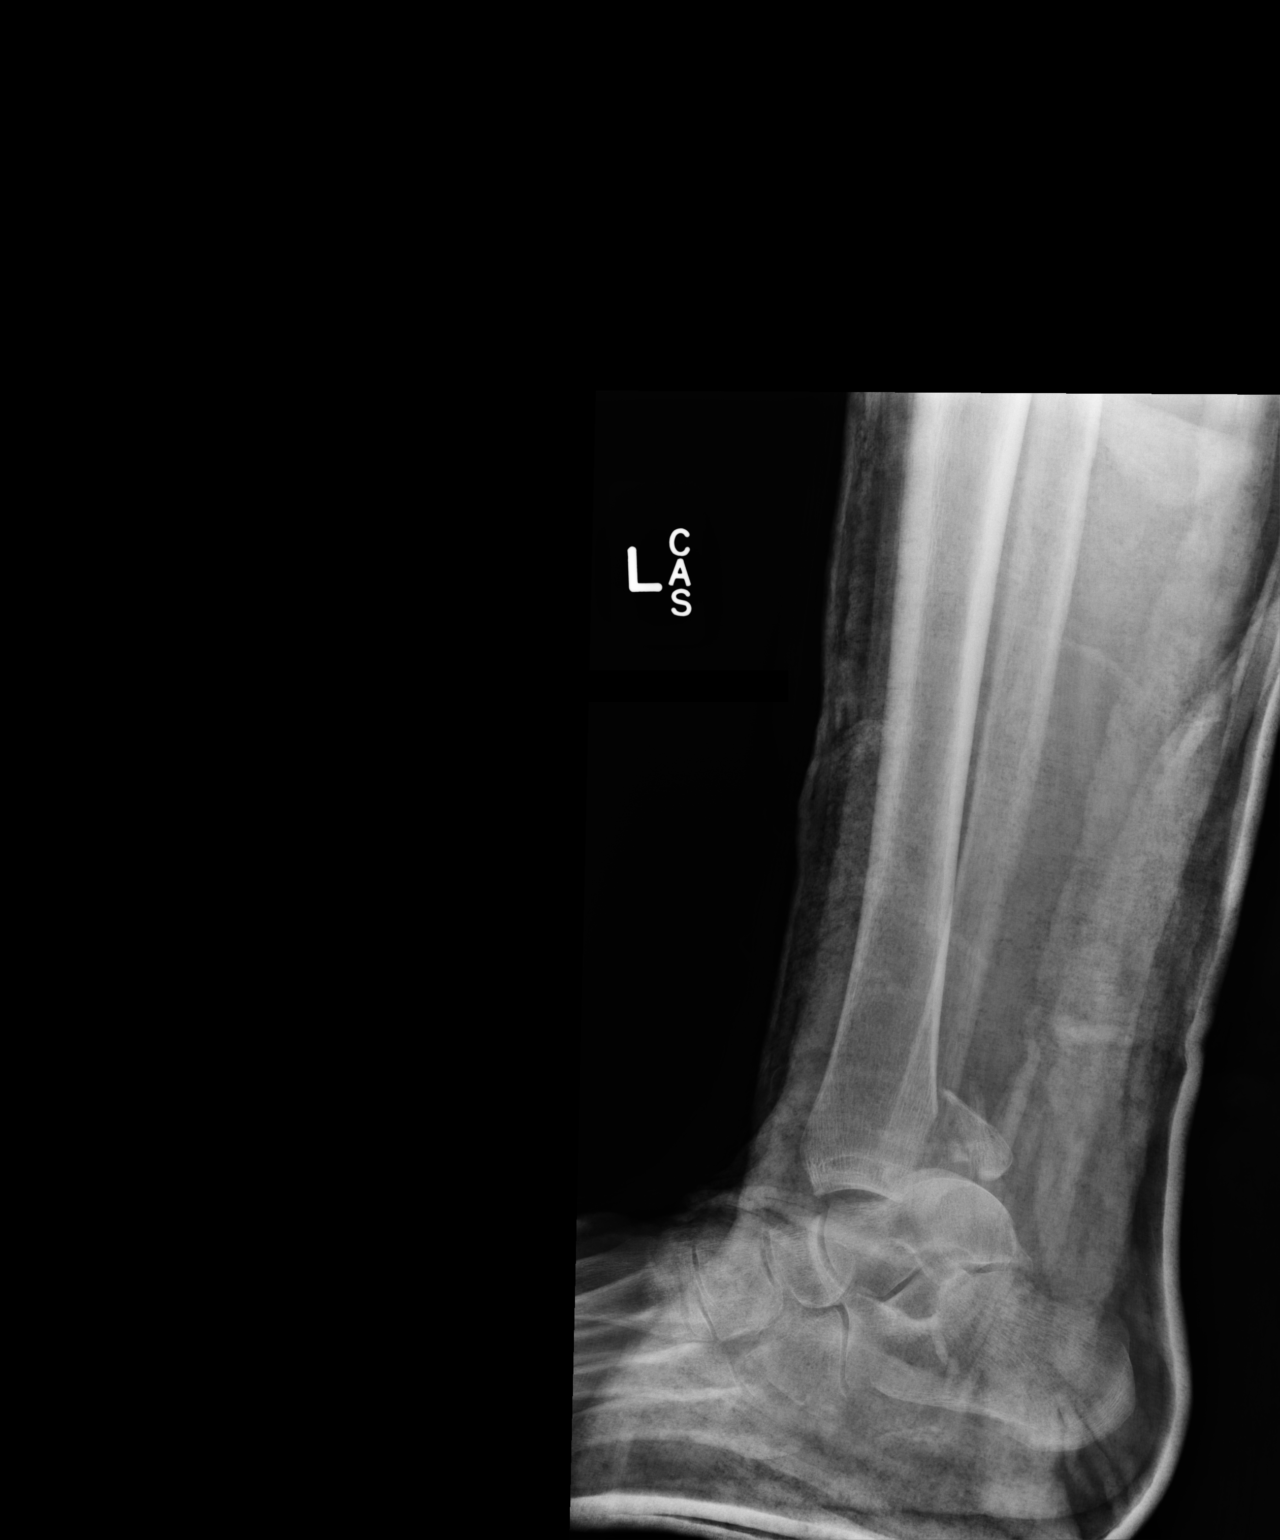

[2 of 2 positions shown; findings below may reference images not displayed]

FINDINGS: Persistent posterior ankle dislocation.
IMPRESSION: Persistent posterior ankle dislocation.

## 2015-12-25 DIAGNOSIS — M9904 Segmental and somatic dysfunction of sacral region: Secondary | ICD-10-CM | POA: Diagnosis not present

## 2015-12-25 DIAGNOSIS — M545 Low back pain: Secondary | ICD-10-CM | POA: Diagnosis not present

## 2015-12-31 DIAGNOSIS — Z136 Encounter for screening for cardiovascular disorders: Secondary | ICD-10-CM | POA: Diagnosis not present

## 2015-12-31 DIAGNOSIS — Z131 Encounter for screening for diabetes mellitus: Secondary | ICD-10-CM | POA: Diagnosis not present

## 2015-12-31 DIAGNOSIS — E559 Vitamin D deficiency, unspecified: Secondary | ICD-10-CM | POA: Diagnosis not present

## 2016-01-02 DIAGNOSIS — Z1389 Encounter for screening for other disorder: Secondary | ICD-10-CM | POA: Diagnosis not present

## 2016-01-02 DIAGNOSIS — M810 Age-related osteoporosis without current pathological fracture: Secondary | ICD-10-CM | POA: Diagnosis not present

## 2016-01-02 DIAGNOSIS — G4730 Sleep apnea, unspecified: Secondary | ICD-10-CM | POA: Diagnosis not present

## 2016-01-02 DIAGNOSIS — J45909 Unspecified asthma, uncomplicated: Secondary | ICD-10-CM | POA: Diagnosis not present

## 2016-01-02 DIAGNOSIS — E559 Vitamin D deficiency, unspecified: Secondary | ICD-10-CM | POA: Diagnosis not present

## 2016-01-02 DIAGNOSIS — F418 Other specified anxiety disorders: Secondary | ICD-10-CM | POA: Diagnosis not present

## 2016-01-02 DIAGNOSIS — Z0000 Encounter for general adult medical examination without abnormal findings: Secondary | ICD-10-CM | POA: Diagnosis not present

## 2016-01-03 DIAGNOSIS — M545 Low back pain: Secondary | ICD-10-CM | POA: Diagnosis not present

## 2016-01-03 DIAGNOSIS — M9904 Segmental and somatic dysfunction of sacral region: Secondary | ICD-10-CM | POA: Diagnosis not present

## 2016-01-14 DIAGNOSIS — H00021 Hordeolum internum right upper eyelid: Secondary | ICD-10-CM | POA: Diagnosis not present

## 2016-01-21 DIAGNOSIS — M545 Low back pain: Secondary | ICD-10-CM | POA: Diagnosis not present

## 2016-01-21 DIAGNOSIS — M9904 Segmental and somatic dysfunction of sacral region: Secondary | ICD-10-CM | POA: Diagnosis not present

## 2016-02-04 DIAGNOSIS — M9904 Segmental and somatic dysfunction of sacral region: Secondary | ICD-10-CM | POA: Diagnosis not present

## 2016-02-04 DIAGNOSIS — M545 Low back pain: Secondary | ICD-10-CM | POA: Diagnosis not present

## 2016-02-25 DIAGNOSIS — M9904 Segmental and somatic dysfunction of sacral region: Secondary | ICD-10-CM | POA: Diagnosis not present

## 2016-02-25 DIAGNOSIS — M545 Low back pain: Secondary | ICD-10-CM | POA: Diagnosis not present

## 2016-03-03 ENCOUNTER — Ambulatory Visit (INDEPENDENT_AMBULATORY_CARE_PROVIDER_SITE_OTHER): Payer: Medicare Other

## 2016-03-03 ENCOUNTER — Encounter (INDEPENDENT_AMBULATORY_CARE_PROVIDER_SITE_OTHER): Payer: Self-pay

## 2016-03-03 ENCOUNTER — Ambulatory Visit (INDEPENDENT_AMBULATORY_CARE_PROVIDER_SITE_OTHER): Payer: Medicare Other | Admitting: Orthopaedic Surgery

## 2016-03-03 DIAGNOSIS — L218 Other seborrheic dermatitis: Secondary | ICD-10-CM | POA: Diagnosis not present

## 2016-03-03 DIAGNOSIS — L821 Other seborrheic keratosis: Secondary | ICD-10-CM | POA: Diagnosis not present

## 2016-03-03 DIAGNOSIS — M25561 Pain in right knee: Secondary | ICD-10-CM

## 2016-03-03 DIAGNOSIS — L853 Xerosis cutis: Secondary | ICD-10-CM | POA: Diagnosis not present

## 2016-03-03 DIAGNOSIS — L438 Other lichen planus: Secondary | ICD-10-CM | POA: Diagnosis not present

## 2016-03-03 DIAGNOSIS — C44519 Basal cell carcinoma of skin of other part of trunk: Secondary | ICD-10-CM | POA: Diagnosis not present

## 2016-03-03 DIAGNOSIS — Z85828 Personal history of other malignant neoplasm of skin: Secondary | ICD-10-CM | POA: Diagnosis not present

## 2016-03-03 DIAGNOSIS — D485 Neoplasm of uncertain behavior of skin: Secondary | ICD-10-CM | POA: Diagnosis not present
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
## 2016-03-10 DIAGNOSIS — H608X3 Other otitis externa, bilateral: Secondary | ICD-10-CM | POA: Insufficient documentation

## 2016-03-10 DIAGNOSIS — H9113 Presbycusis, bilateral: Secondary | ICD-10-CM | POA: Diagnosis not present

## 2016-03-31 ENCOUNTER — Ambulatory Visit (INDEPENDENT_AMBULATORY_CARE_PROVIDER_SITE_OTHER): Payer: Medicare Other | Admitting: Physician Assistant

## 2016-03-31 DIAGNOSIS — M7061 Trochanteric bursitis, right hip: Secondary | ICD-10-CM | POA: Diagnosis not present

## 2016-03-31 DIAGNOSIS — M25561 Pain in right knee: Secondary | ICD-10-CM
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
## 2016-04-02 ENCOUNTER — Ambulatory Visit: Payer: Medicare Other | Attending: Orthopaedic Surgery | Admitting: Physical Therapy

## 2016-04-02 DIAGNOSIS — R262 Difficulty in walking, not elsewhere classified: Secondary | ICD-10-CM | POA: Insufficient documentation

## 2016-04-02 DIAGNOSIS — M25561 Pain in right knee: Secondary | ICD-10-CM | POA: Diagnosis not present

## 2016-04-02 DIAGNOSIS — M6281 Muscle weakness (generalized): Secondary | ICD-10-CM | POA: Insufficient documentation

## 2016-04-02 DIAGNOSIS — M25551 Pain in right hip: Secondary | ICD-10-CM | POA: Diagnosis not present
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
## 2016-04-02 NOTE — Patient Instructions (Signed)
Straight Leg Raise    Tighten stomach and slowly raise locked right leg __6__ inches from floor. Repeat _15___ times per set. Do _2___ sessions per day.    Quadriceps (Stand)    Stand holding onto stationary object. Grasp right ankle. Pull knee back. Keep back straight. Do not rotate or arch back. Hold __30__ seconds. Repeat __3__ times. Do __2__ sessions per day. CAUTION: Stretch should be gentle, steady and slow.    Outer Hip Stretch: Reclined IT Band Stretch (Strap)    Strap around opposite foot, pull across only as far as possible with shoulders on mat. Hold for __30__ breaths. Repeat __3__ times each leg.

## 2016-04-06 ENCOUNTER — Ambulatory Visit: Payer: Medicare Other | Admitting: Physical Therapy

## 2016-04-06 DIAGNOSIS — R262 Difficulty in walking, not elsewhere classified: Secondary | ICD-10-CM | POA: Diagnosis not present

## 2016-04-06 DIAGNOSIS — M25561 Pain in right knee: Secondary | ICD-10-CM | POA: Diagnosis not present

## 2016-04-06 DIAGNOSIS — M6281 Muscle weakness (generalized): Secondary | ICD-10-CM | POA: Diagnosis not present

## 2016-04-06 DIAGNOSIS — M25551 Pain in right hip: Secondary | ICD-10-CM | POA: Diagnosis not present

## 2016-04-06 NOTE — Patient Instructions (Signed)
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
## 2016-04-08 ENCOUNTER — Ambulatory Visit: Payer: Medicare Other

## 2016-04-10 ENCOUNTER — Ambulatory Visit: Payer: Medicare Other | Admitting: Physical Therapy

## 2016-04-10 DIAGNOSIS — R262 Difficulty in walking, not elsewhere classified: Secondary | ICD-10-CM

## 2016-04-10 DIAGNOSIS — M25561 Pain in right knee: Secondary | ICD-10-CM | POA: Diagnosis not present

## 2016-04-10 DIAGNOSIS — M6281 Muscle weakness (generalized): Secondary | ICD-10-CM | POA: Diagnosis not present

## 2016-04-10 DIAGNOSIS — M25551 Pain in right hip: Secondary | ICD-10-CM | POA: Diagnosis not present
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
## 2016-04-10 NOTE — Patient Instructions (Signed)
Bridge    Lie back, legs bent. Inhale, pressing hips up. Keeping ribs in, lengthen lower back. Exhale, rolling down along spine from top. Repeat __15__ times. Do __2__ sessions per day.    Terminal Knee Extension (Standing)    Facing anchor with right knee slightly bent and tubing just above knee, gently pull knee back straight. Do not overextend knee. Repeat __15__ times per set. Do ___2_ sets per session.

## 2016-04-13 ENCOUNTER — Ambulatory Visit: Payer: Medicare Other | Admitting: Physical Therapy

## 2016-04-13 DIAGNOSIS — M25551 Pain in right hip: Secondary | ICD-10-CM | POA: Diagnosis not present

## 2016-04-13 DIAGNOSIS — M6281 Muscle weakness (generalized): Secondary | ICD-10-CM

## 2016-04-13 DIAGNOSIS — R262 Difficulty in walking, not elsewhere classified: Secondary | ICD-10-CM

## 2016-04-13 DIAGNOSIS — M25561 Pain in right knee: Secondary | ICD-10-CM
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
## 2016-04-15 ENCOUNTER — Ambulatory Visit: Payer: Medicare Other | Admitting: Physical Therapy

## 2016-04-15 DIAGNOSIS — R262 Difficulty in walking, not elsewhere classified: Secondary | ICD-10-CM | POA: Diagnosis not present

## 2016-04-15 DIAGNOSIS — M6281 Muscle weakness (generalized): Secondary | ICD-10-CM

## 2016-04-15 DIAGNOSIS — M25561 Pain in right knee: Secondary | ICD-10-CM

## 2016-04-15 DIAGNOSIS — M25551 Pain in right hip: Secondary | ICD-10-CM | POA: Diagnosis not present
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
## 2016-04-16 ENCOUNTER — Ambulatory Visit: Payer: Medicare Other

## 2016-04-20 ENCOUNTER — Ambulatory Visit: Payer: Medicare Other | Admitting: Physical Therapy

## 2016-04-20 DIAGNOSIS — M25561 Pain in right knee: Secondary | ICD-10-CM

## 2016-04-20 DIAGNOSIS — M6281 Muscle weakness (generalized): Secondary | ICD-10-CM | POA: Diagnosis not present

## 2016-04-20 DIAGNOSIS — M25551 Pain in right hip: Secondary | ICD-10-CM

## 2016-04-20 DIAGNOSIS — R262 Difficulty in walking, not elsewhere classified: Secondary | ICD-10-CM
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
## 2016-04-22 ENCOUNTER — Ambulatory Visit: Payer: Medicare Other | Admitting: Physical Therapy

## 2016-04-22 DIAGNOSIS — M25561 Pain in right knee: Secondary | ICD-10-CM | POA: Diagnosis not present

## 2016-04-22 DIAGNOSIS — M25551 Pain in right hip: Secondary | ICD-10-CM | POA: Diagnosis not present

## 2016-04-22 DIAGNOSIS — M6281 Muscle weakness (generalized): Secondary | ICD-10-CM | POA: Diagnosis not present

## 2016-04-22 DIAGNOSIS — R262 Difficulty in walking, not elsewhere classified: Secondary | ICD-10-CM | POA: Diagnosis not present
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
## 2016-04-22 NOTE — Patient Instructions (Signed)
Hip Flexor Stretch    Interlace fingers on top of right knee. Shift weight forward and hold position for _30__ breaths. Repeat on other leg. Alternate sides _3__ times.

## 2016-04-27 ENCOUNTER — Ambulatory Visit: Payer: Medicare Other | Attending: Orthopaedic Surgery | Admitting: Physical Therapy

## 2016-04-27 DIAGNOSIS — R262 Difficulty in walking, not elsewhere classified: Secondary | ICD-10-CM | POA: Diagnosis not present

## 2016-04-27 DIAGNOSIS — M6281 Muscle weakness (generalized): Secondary | ICD-10-CM

## 2016-04-27 DIAGNOSIS — M25551 Pain in right hip: Secondary | ICD-10-CM | POA: Diagnosis not present

## 2016-04-27 DIAGNOSIS — M25561 Pain in right knee: Secondary | ICD-10-CM | POA: Diagnosis not present
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
## 2016-04-28 ENCOUNTER — Ambulatory Visit (INDEPENDENT_AMBULATORY_CARE_PROVIDER_SITE_OTHER): Payer: Medicare Other | Admitting: Orthopaedic Surgery

## 2016-04-28 DIAGNOSIS — G8929 Other chronic pain: Secondary | ICD-10-CM

## 2016-04-28 DIAGNOSIS — M25561 Pain in right knee: Secondary | ICD-10-CM | POA: Diagnosis not present

## 2016-04-28 NOTE — Progress Notes (Signed)
The patient is here in follow-up for her right knee. She had tweaked her knee working on Christmas decorations. Since we had injected it and sent her to physical therapy she has done significantly better. She says her pain is minimal and said therapy has helped greatly and she overall she is doing well.  On examination of her right knee her pain is a little bit over the IT band laterally at the knee itself but is no knee joint effusion. Her range of motion is full. Her knee is ligamentously stable  At this point she is doing well she'll follow-up as needed. If she continues to have any type of symptoms or they recur we can certainly try injections again if needed.

## 2016-04-30 ENCOUNTER — Ambulatory Visit: Payer: Medicare Other | Admitting: Physical Therapy

## 2016-04-30 DIAGNOSIS — M25551 Pain in right hip: Secondary | ICD-10-CM | POA: Diagnosis not present

## 2016-04-30 DIAGNOSIS — M25561 Pain in right knee: Secondary | ICD-10-CM | POA: Diagnosis not present

## 2016-04-30 DIAGNOSIS — R262 Difficulty in walking, not elsewhere classified: Secondary | ICD-10-CM

## 2016-04-30 DIAGNOSIS — M6281 Muscle weakness (generalized): Secondary | ICD-10-CM
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
## 2016-05-04 ENCOUNTER — Encounter: Payer: Medicare Other | Admitting: Physical Therapy

## 2016-05-05 ENCOUNTER — Ambulatory Visit: Payer: Medicare Other | Admitting: Physical Therapy

## 2016-05-06 ENCOUNTER — Ambulatory Visit: Payer: Medicare Other | Admitting: Physical Therapy

## 2016-05-06 DIAGNOSIS — R262 Difficulty in walking, not elsewhere classified: Secondary | ICD-10-CM

## 2016-05-06 DIAGNOSIS — M6281 Muscle weakness (generalized): Secondary | ICD-10-CM

## 2016-05-06 DIAGNOSIS — M25551 Pain in right hip: Secondary | ICD-10-CM | POA: Diagnosis not present

## 2016-05-06 DIAGNOSIS — M25561 Pain in right knee: Secondary | ICD-10-CM
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
## 2016-05-07 ENCOUNTER — Ambulatory Visit: Payer: Medicare Other | Admitting: Physical Therapy

## 2016-05-07 DIAGNOSIS — M6281 Muscle weakness (generalized): Secondary | ICD-10-CM

## 2016-05-07 DIAGNOSIS — M25561 Pain in right knee: Secondary | ICD-10-CM | POA: Diagnosis not present

## 2016-05-07 DIAGNOSIS — M25551 Pain in right hip: Secondary | ICD-10-CM

## 2016-05-07 DIAGNOSIS — R262 Difficulty in walking, not elsewhere classified: Secondary | ICD-10-CM | POA: Diagnosis not present
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
## 2016-05-12 ENCOUNTER — Ambulatory Visit: Payer: Medicare Other | Admitting: Physical Therapy

## 2016-05-12 DIAGNOSIS — M25551 Pain in right hip: Secondary | ICD-10-CM | POA: Diagnosis not present

## 2016-05-12 DIAGNOSIS — M25561 Pain in right knee: Secondary | ICD-10-CM | POA: Diagnosis not present

## 2016-05-12 DIAGNOSIS — M6281 Muscle weakness (generalized): Secondary | ICD-10-CM | POA: Diagnosis not present

## 2016-05-12 DIAGNOSIS — R262 Difficulty in walking, not elsewhere classified: Secondary | ICD-10-CM | POA: Diagnosis not present
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
## 2016-07-07 DIAGNOSIS — H40023 Open angle with borderline findings, high risk, bilateral: Secondary | ICD-10-CM | POA: Diagnosis not present

## 2016-07-07 DIAGNOSIS — H353133 Nonexudative age-related macular degeneration, bilateral, advanced atrophic without subfoveal involvement: Secondary | ICD-10-CM | POA: Diagnosis not present

## 2016-10-07 DIAGNOSIS — Z1272 Encounter for screening for malignant neoplasm of vagina: Secondary | ICD-10-CM | POA: Diagnosis not present

## 2016-10-07 DIAGNOSIS — Z6827 Body mass index (BMI) 27.0-27.9, adult: Secondary | ICD-10-CM | POA: Diagnosis not present

## 2016-10-07 DIAGNOSIS — Z124 Encounter for screening for malignant neoplasm of cervix: Secondary | ICD-10-CM | POA: Diagnosis not present

## 2016-10-07 DIAGNOSIS — Z01419 Encounter for gynecological examination (general) (routine) without abnormal findings: Secondary | ICD-10-CM | POA: Diagnosis not present

## 2016-10-07 DIAGNOSIS — Z1231 Encounter for screening mammogram for malignant neoplasm of breast: Secondary | ICD-10-CM | POA: Diagnosis not present

## 2016-12-22 ENCOUNTER — Ambulatory Visit (INDEPENDENT_AMBULATORY_CARE_PROVIDER_SITE_OTHER): Payer: Medicare Other

## 2016-12-22 ENCOUNTER — Ambulatory Visit (INDEPENDENT_AMBULATORY_CARE_PROVIDER_SITE_OTHER): Payer: Medicare Other | Admitting: Orthopaedic Surgery

## 2016-12-22 DIAGNOSIS — M79672 Pain in left foot: Secondary | ICD-10-CM | POA: Diagnosis not present
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
## 2017-01-11 DIAGNOSIS — M8580 Other specified disorders of bone density and structure, unspecified site: Secondary | ICD-10-CM | POA: Diagnosis not present

## 2017-01-11 DIAGNOSIS — F418 Other specified anxiety disorders: Secondary | ICD-10-CM | POA: Diagnosis not present

## 2017-01-11 DIAGNOSIS — D1801 Hemangioma of skin and subcutaneous tissue: Secondary | ICD-10-CM | POA: Diagnosis not present

## 2017-01-11 DIAGNOSIS — R799 Abnormal finding of blood chemistry, unspecified: Secondary | ICD-10-CM | POA: Diagnosis not present

## 2017-01-11 DIAGNOSIS — Z1389 Encounter for screening for other disorder: Secondary | ICD-10-CM | POA: Diagnosis not present

## 2017-01-11 DIAGNOSIS — Z85828 Personal history of other malignant neoplasm of skin: Secondary | ICD-10-CM | POA: Diagnosis not present

## 2017-01-11 DIAGNOSIS — E559 Vitamin D deficiency, unspecified: Secondary | ICD-10-CM | POA: Diagnosis not present

## 2017-01-11 DIAGNOSIS — L7211 Pilar cyst: Secondary | ICD-10-CM | POA: Diagnosis not present

## 2017-01-11 DIAGNOSIS — Z0000 Encounter for general adult medical examination without abnormal findings: Secondary | ICD-10-CM | POA: Diagnosis not present

## 2017-01-11 DIAGNOSIS — D045 Carcinoma in situ of skin of trunk: Secondary | ICD-10-CM | POA: Diagnosis not present

## 2017-01-11 DIAGNOSIS — L820 Inflamed seborrheic keratosis: Secondary | ICD-10-CM | POA: Diagnosis not present

## 2017-01-11 DIAGNOSIS — J45909 Unspecified asthma, uncomplicated: Secondary | ICD-10-CM | POA: Diagnosis not present

## 2017-01-11 DIAGNOSIS — L821 Other seborrheic keratosis: Secondary | ICD-10-CM | POA: Diagnosis not present

## 2017-01-11 DIAGNOSIS — L738 Other specified follicular disorders: Secondary | ICD-10-CM | POA: Diagnosis not present

## 2017-01-11 DIAGNOSIS — D485 Neoplasm of uncertain behavior of skin: Secondary | ICD-10-CM | POA: Diagnosis not present

## 2017-01-11 DIAGNOSIS — G4730 Sleep apnea, unspecified: Secondary | ICD-10-CM | POA: Diagnosis not present

## 2017-01-15 DIAGNOSIS — E559 Vitamin D deficiency, unspecified: Secondary | ICD-10-CM | POA: Diagnosis not present

## 2017-01-15 DIAGNOSIS — Z23 Encounter for immunization: Secondary | ICD-10-CM | POA: Diagnosis not present

## 2017-01-15 DIAGNOSIS — M810 Age-related osteoporosis without current pathological fracture: Secondary | ICD-10-CM | POA: Diagnosis not present

## 2017-01-15 DIAGNOSIS — Z0000 Encounter for general adult medical examination without abnormal findings: Secondary | ICD-10-CM | POA: Diagnosis not present

## 2017-01-15 DIAGNOSIS — J45909 Unspecified asthma, uncomplicated: Secondary | ICD-10-CM | POA: Diagnosis not present

## 2017-01-15 DIAGNOSIS — F418 Other specified anxiety disorders: Secondary | ICD-10-CM | POA: Diagnosis not present

## 2017-03-12 DIAGNOSIS — H01114 Allergic dermatitis of left upper eyelid: Secondary | ICD-10-CM | POA: Diagnosis not present

## 2017-06-19 DIAGNOSIS — J0100 Acute maxillary sinusitis, unspecified: Secondary | ICD-10-CM | POA: Diagnosis not present

## 2017-08-24 DIAGNOSIS — H40022 Open angle with borderline findings, high risk, left eye: Secondary | ICD-10-CM | POA: Diagnosis not present

## 2017-10-11 DIAGNOSIS — N958 Other specified menopausal and perimenopausal disorders: Secondary | ICD-10-CM | POA: Diagnosis not present

## 2017-10-11 DIAGNOSIS — R351 Nocturia: Secondary | ICD-10-CM | POA: Diagnosis not present

## 2017-10-11 DIAGNOSIS — M8588 Other specified disorders of bone density and structure, other site: Secondary | ICD-10-CM | POA: Diagnosis not present

## 2017-10-11 DIAGNOSIS — Z1231 Encounter for screening mammogram for malignant neoplasm of breast: Secondary | ICD-10-CM | POA: Diagnosis not present

## 2017-10-11 DIAGNOSIS — Z6826 Body mass index (BMI) 26.0-26.9, adult: Secondary | ICD-10-CM | POA: Diagnosis not present

## 2017-10-11 DIAGNOSIS — Z01419 Encounter for gynecological examination (general) (routine) without abnormal findings: Secondary | ICD-10-CM | POA: Diagnosis not present

## 2017-10-12 ENCOUNTER — Other Ambulatory Visit: Payer: Self-pay | Admitting: Obstetrics & Gynecology

## 2017-10-12 DIAGNOSIS — R928 Other abnormal and inconclusive findings on diagnostic imaging of breast: Secondary | ICD-10-CM

## 2017-10-15 ENCOUNTER — Ambulatory Visit
Admission: RE | Admit: 2017-10-15 | Discharge: 2017-10-15 | Disposition: A | Discharge status: Home / Self Care | Payer: Medicare Other | Source: Ambulatory Visit | Attending: Obstetrics & Gynecology | Admitting: Obstetrics & Gynecology

## 2017-10-15 DIAGNOSIS — R928 Other abnormal and inconclusive findings on diagnostic imaging of breast: Secondary | ICD-10-CM

## 2017-10-15 DIAGNOSIS — N6011 Diffuse cystic mastopathy of right breast: Secondary | ICD-10-CM | POA: Diagnosis not present

## 2017-11-18 DIAGNOSIS — D485 Neoplasm of uncertain behavior of skin: Secondary | ICD-10-CM | POA: Diagnosis not present

## 2017-11-18 DIAGNOSIS — L570 Actinic keratosis: Secondary | ICD-10-CM | POA: Diagnosis not present

## 2017-11-18 DIAGNOSIS — L7211 Pilar cyst: Secondary | ICD-10-CM | POA: Diagnosis not present

## 2017-11-18 DIAGNOSIS — L821 Other seborrheic keratosis: Secondary | ICD-10-CM | POA: Diagnosis not present

## 2017-11-18 DIAGNOSIS — Z85828 Personal history of other malignant neoplasm of skin: Secondary | ICD-10-CM | POA: Diagnosis not present

## 2017-11-25 DIAGNOSIS — Z4802 Encounter for removal of sutures: Secondary | ICD-10-CM | POA: Diagnosis not present

## 2017-12-04 DIAGNOSIS — J018 Other acute sinusitis: Secondary | ICD-10-CM | POA: Diagnosis not present

## 2018-01-26 DIAGNOSIS — E559 Vitamin D deficiency, unspecified: Secondary | ICD-10-CM | POA: Diagnosis not present

## 2018-01-26 DIAGNOSIS — Z833 Family history of diabetes mellitus: Secondary | ICD-10-CM | POA: Diagnosis not present

## 2018-01-28 DIAGNOSIS — F418 Other specified anxiety disorders: Secondary | ICD-10-CM | POA: Diagnosis not present

## 2018-01-28 DIAGNOSIS — Z1389 Encounter for screening for other disorder: Secondary | ICD-10-CM | POA: Diagnosis not present

## 2018-01-28 DIAGNOSIS — K589 Irritable bowel syndrome without diarrhea: Secondary | ICD-10-CM | POA: Diagnosis not present

## 2018-01-28 DIAGNOSIS — Z8679 Personal history of other diseases of the circulatory system: Secondary | ICD-10-CM | POA: Diagnosis not present

## 2018-01-28 DIAGNOSIS — M810 Age-related osteoporosis without current pathological fracture: Secondary | ICD-10-CM | POA: Diagnosis not present

## 2018-01-28 DIAGNOSIS — J45909 Unspecified asthma, uncomplicated: Secondary | ICD-10-CM | POA: Diagnosis not present

## 2018-01-28 DIAGNOSIS — G4730 Sleep apnea, unspecified: Secondary | ICD-10-CM | POA: Diagnosis not present

## 2018-01-28 DIAGNOSIS — E559 Vitamin D deficiency, unspecified: Secondary | ICD-10-CM | POA: Diagnosis not present

## 2018-01-28 DIAGNOSIS — K219 Gastro-esophageal reflux disease without esophagitis: Secondary | ICD-10-CM | POA: Diagnosis not present

## 2018-01-28 DIAGNOSIS — Z0000 Encounter for general adult medical examination without abnormal findings: Secondary | ICD-10-CM | POA: Diagnosis not present

## 2018-01-28 DIAGNOSIS — R030 Elevated blood-pressure reading, without diagnosis of hypertension: Secondary | ICD-10-CM | POA: Diagnosis not present

## 2018-02-15 DIAGNOSIS — I10 Essential (primary) hypertension: Secondary | ICD-10-CM | POA: Diagnosis not present

## 2018-02-15 DIAGNOSIS — Z79899 Other long term (current) drug therapy: Secondary | ICD-10-CM | POA: Diagnosis not present

## 2018-02-15 DIAGNOSIS — J45909 Unspecified asthma, uncomplicated: Secondary | ICD-10-CM | POA: Diagnosis not present

## 2018-03-17 DIAGNOSIS — Z79899 Other long term (current) drug therapy: Secondary | ICD-10-CM | POA: Diagnosis not present

## 2018-03-17 DIAGNOSIS — I10 Essential (primary) hypertension: Secondary | ICD-10-CM | POA: Diagnosis not present

## 2018-03-17 DIAGNOSIS — J45909 Unspecified asthma, uncomplicated: Secondary | ICD-10-CM | POA: Diagnosis not present

## 2018-04-14 DIAGNOSIS — Z961 Presence of intraocular lens: Secondary | ICD-10-CM | POA: Diagnosis not present

## 2018-04-14 DIAGNOSIS — H02403 Unspecified ptosis of bilateral eyelids: Secondary | ICD-10-CM | POA: Diagnosis not present

## 2018-04-14 DIAGNOSIS — H0100A Unspecified blepharitis right eye, upper and lower eyelids: Secondary | ICD-10-CM | POA: Diagnosis not present

## 2018-04-14 DIAGNOSIS — H0100B Unspecified blepharitis left eye, upper and lower eyelids: Secondary | ICD-10-CM | POA: Diagnosis not present

## 2018-10-19 DIAGNOSIS — R3915 Urgency of urination: Secondary | ICD-10-CM | POA: Diagnosis not present

## 2018-10-19 DIAGNOSIS — Z1231 Encounter for screening mammogram for malignant neoplasm of breast: Secondary | ICD-10-CM | POA: Diagnosis not present

## 2018-10-19 DIAGNOSIS — Z124 Encounter for screening for malignant neoplasm of cervix: Secondary | ICD-10-CM | POA: Diagnosis not present

## 2018-10-19 DIAGNOSIS — Z6826 Body mass index (BMI) 26.0-26.9, adult: Secondary | ICD-10-CM | POA: Diagnosis not present

## 2018-12-23 DIAGNOSIS — Z23 Encounter for immunization: Secondary | ICD-10-CM | POA: Diagnosis not present

## 2019-04-17 DIAGNOSIS — H524 Presbyopia: Secondary | ICD-10-CM | POA: Diagnosis not present

## 2019-04-17 DIAGNOSIS — Z961 Presence of intraocular lens: Secondary | ICD-10-CM | POA: Diagnosis not present

## 2019-04-18 DIAGNOSIS — J45909 Unspecified asthma, uncomplicated: Secondary | ICD-10-CM | POA: Diagnosis not present

## 2019-04-18 DIAGNOSIS — G4730 Sleep apnea, unspecified: Secondary | ICD-10-CM | POA: Diagnosis not present

## 2019-04-18 DIAGNOSIS — I10 Essential (primary) hypertension: Secondary | ICD-10-CM | POA: Diagnosis not present

## 2019-04-18 DIAGNOSIS — Z79899 Other long term (current) drug therapy: Secondary | ICD-10-CM | POA: Diagnosis not present

## 2019-04-18 DIAGNOSIS — Z833 Family history of diabetes mellitus: Secondary | ICD-10-CM | POA: Diagnosis not present

## 2019-04-18 DIAGNOSIS — E559 Vitamin D deficiency, unspecified: Secondary | ICD-10-CM | POA: Diagnosis not present

## 2019-04-18 DIAGNOSIS — Z0000 Encounter for general adult medical examination without abnormal findings: Secondary | ICD-10-CM | POA: Diagnosis not present

## 2019-04-18 DIAGNOSIS — F418 Other specified anxiety disorders: Secondary | ICD-10-CM | POA: Diagnosis not present

## 2019-04-18 DIAGNOSIS — K219 Gastro-esophageal reflux disease without esophagitis: Secondary | ICD-10-CM | POA: Diagnosis not present

## 2019-04-18 DIAGNOSIS — K589 Irritable bowel syndrome without diarrhea: Secondary | ICD-10-CM | POA: Diagnosis not present

## 2019-04-18 DIAGNOSIS — M810 Age-related osteoporosis without current pathological fracture: Secondary | ICD-10-CM | POA: Diagnosis not present

## 2019-05-26 DIAGNOSIS — K589 Irritable bowel syndrome without diarrhea: Secondary | ICD-10-CM | POA: Diagnosis not present

## 2019-05-26 DIAGNOSIS — Z131 Encounter for screening for diabetes mellitus: Secondary | ICD-10-CM | POA: Diagnosis not present

## 2019-05-26 DIAGNOSIS — Z79899 Other long term (current) drug therapy: Secondary | ICD-10-CM | POA: Diagnosis not present

## 2019-05-26 DIAGNOSIS — J45909 Unspecified asthma, uncomplicated: Secondary | ICD-10-CM | POA: Diagnosis not present

## 2019-05-26 DIAGNOSIS — E559 Vitamin D deficiency, unspecified: Secondary | ICD-10-CM | POA: Diagnosis not present

## 2019-05-26 DIAGNOSIS — F418 Other specified anxiety disorders: Secondary | ICD-10-CM | POA: Diagnosis not present

## 2019-05-26 DIAGNOSIS — I10 Essential (primary) hypertension: Secondary | ICD-10-CM | POA: Diagnosis not present

## 2019-05-26 DIAGNOSIS — Z0000 Encounter for general adult medical examination without abnormal findings: Secondary | ICD-10-CM | POA: Diagnosis not present

## 2019-05-26 DIAGNOSIS — G4730 Sleep apnea, unspecified: Secondary | ICD-10-CM | POA: Diagnosis not present

## 2019-05-26 DIAGNOSIS — M810 Age-related osteoporosis without current pathological fracture: Secondary | ICD-10-CM | POA: Diagnosis not present

## 2019-05-26 DIAGNOSIS — K219 Gastro-esophageal reflux disease without esophagitis: Secondary | ICD-10-CM | POA: Diagnosis not present

## 2019-10-24 DIAGNOSIS — Z1231 Encounter for screening mammogram for malignant neoplasm of breast: Secondary | ICD-10-CM | POA: Diagnosis not present

## 2019-10-24 DIAGNOSIS — Z01419 Encounter for gynecological examination (general) (routine) without abnormal findings: Secondary | ICD-10-CM | POA: Diagnosis not present

## 2019-10-24 DIAGNOSIS — Z6827 Body mass index (BMI) 27.0-27.9, adult: Secondary | ICD-10-CM | POA: Diagnosis not present

## 2019-12-15 ENCOUNTER — Ambulatory Visit: Payer: Medicare Other | Attending: Internal Medicine

## 2019-12-15 DIAGNOSIS — Z23 Encounter for immunization: Secondary | ICD-10-CM

## 2019-12-15 NOTE — Progress Notes (Signed)
   Covid-19 Vaccination Clinic  Name:  Madison Wall    MRN: [id number] DOB: 11-01-1940  12/15/2019  Madison Wall was observed post Covid-19 immunization for 15 minutes without incident. She was provided with Vaccine Information Sheet and instruction to access the V-Safe system. Vaccinated by [employer].  Madison Wall was instructed to call 911 with any severe reactions post vaccine: Marland Kitchen Difficulty breathing  . Swelling of face and throat  . A fast heartbeat  . A bad rash all over body  . Dizziness and weakness

## 2019-12-19 ENCOUNTER — Ambulatory Visit: Payer: Medicare Other

## 2020-01-11 DIAGNOSIS — Z23 Encounter for immunization: Secondary | ICD-10-CM | POA: Diagnosis not present

## 2020-04-01 ENCOUNTER — Ambulatory Visit (INDEPENDENT_AMBULATORY_CARE_PROVIDER_SITE_OTHER): Payer: Medicare Other

## 2020-04-01 ENCOUNTER — Ambulatory Visit: Payer: Self-pay

## 2020-04-01 ENCOUNTER — Ambulatory Visit (INDEPENDENT_AMBULATORY_CARE_PROVIDER_SITE_OTHER): Payer: Medicare Other | Admitting: Physician Assistant

## 2020-04-01 ENCOUNTER — Encounter: Payer: Self-pay | Admitting: Physician Assistant

## 2020-04-01 DIAGNOSIS — G8929 Other chronic pain: Secondary | ICD-10-CM

## 2020-04-01 DIAGNOSIS — M25561 Pain in right knee: Secondary | ICD-10-CM

## 2020-04-01 DIAGNOSIS — M25562 Pain in left knee: Secondary | ICD-10-CM

## 2020-04-01 MED ORDER — LIDOCAINE HCL 1 % IJ SOLN
0.5000 mL | INTRAMUSCULAR | Status: AC | PRN
  Administered 2020-04-01: .5 mL

## 2020-04-01 MED ORDER — LIDOCAINE HCL 1 % IJ SOLN
5.0000 mL | INTRAMUSCULAR | Status: AC | PRN
  Administered 2020-04-01: 5 mL
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
## 2020-04-03 ENCOUNTER — Other Ambulatory Visit: Payer: Self-pay

## 2020-04-03 DIAGNOSIS — G8929 Other chronic pain: Secondary | ICD-10-CM

## 2020-04-23 ENCOUNTER — Other Ambulatory Visit: Payer: Self-pay

## 2020-04-23 ENCOUNTER — Encounter: Payer: Self-pay | Admitting: Physical Therapy

## 2020-04-23 ENCOUNTER — Ambulatory Visit: Payer: Medicare Other | Attending: Orthopaedic Surgery | Admitting: Physical Therapy

## 2020-04-23 DIAGNOSIS — M25551 Pain in right hip: Secondary | ICD-10-CM | POA: Diagnosis not present

## 2020-04-23 DIAGNOSIS — R262 Difficulty in walking, not elsewhere classified: Secondary | ICD-10-CM | POA: Insufficient documentation

## 2020-04-23 DIAGNOSIS — M25562 Pain in left knee: Secondary | ICD-10-CM

## 2020-04-23 DIAGNOSIS — M25561 Pain in right knee: Secondary | ICD-10-CM
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
## 2020-04-24 ENCOUNTER — Ambulatory Visit: Payer: Medicare Other | Admitting: Physical Therapy

## 2020-04-24 ENCOUNTER — Encounter: Payer: Self-pay | Admitting: Physical Therapy

## 2020-04-24 DIAGNOSIS — R262 Difficulty in walking, not elsewhere classified: Secondary | ICD-10-CM

## 2020-04-24 DIAGNOSIS — M25551 Pain in right hip: Secondary | ICD-10-CM

## 2020-04-24 DIAGNOSIS — M25562 Pain in left knee: Secondary | ICD-10-CM | POA: Diagnosis not present

## 2020-04-24 DIAGNOSIS — M25561 Pain in right knee: Secondary | ICD-10-CM | POA: Diagnosis not present
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
## 2020-04-26 ENCOUNTER — Ambulatory Visit: Payer: Medicare Other | Admitting: Physical Therapy

## 2020-04-26 ENCOUNTER — Encounter: Payer: Self-pay | Admitting: Physical Therapy

## 2020-04-26 ENCOUNTER — Other Ambulatory Visit: Payer: Self-pay

## 2020-04-26 DIAGNOSIS — R262 Difficulty in walking, not elsewhere classified: Secondary | ICD-10-CM | POA: Diagnosis not present

## 2020-04-26 DIAGNOSIS — M25562 Pain in left knee: Secondary | ICD-10-CM

## 2020-04-26 DIAGNOSIS — M25561 Pain in right knee: Secondary | ICD-10-CM

## 2020-04-26 DIAGNOSIS — M25551 Pain in right hip: Secondary | ICD-10-CM
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
## 2020-04-29 ENCOUNTER — Ambulatory Visit (INDEPENDENT_AMBULATORY_CARE_PROVIDER_SITE_OTHER): Payer: Medicare Other | Admitting: Physician Assistant

## 2020-04-29 ENCOUNTER — Encounter: Payer: Self-pay | Admitting: Physician Assistant

## 2020-04-29 DIAGNOSIS — G8929 Other chronic pain: Secondary | ICD-10-CM | POA: Diagnosis not present

## 2020-04-29 DIAGNOSIS — M25562 Pain in left knee: Secondary | ICD-10-CM

## 2020-04-29 DIAGNOSIS — M25561 Pain in right knee: Secondary | ICD-10-CM
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
## 2020-04-29 NOTE — Addendum Note (Signed)
Addended by: Robyne Peers on: 04/29/2020 08:51 AM   Modules accepted: Orders

## 2020-05-01 ENCOUNTER — Encounter: Payer: Self-pay | Admitting: Physical Therapy

## 2020-05-01 ENCOUNTER — Other Ambulatory Visit: Payer: Self-pay

## 2020-05-01 ENCOUNTER — Ambulatory Visit: Payer: Medicare Other | Admitting: Physical Therapy

## 2020-05-01 DIAGNOSIS — M25562 Pain in left knee: Secondary | ICD-10-CM

## 2020-05-01 DIAGNOSIS — R262 Difficulty in walking, not elsewhere classified: Secondary | ICD-10-CM | POA: Diagnosis not present

## 2020-05-01 DIAGNOSIS — M25551 Pain in right hip: Secondary | ICD-10-CM | POA: Diagnosis not present

## 2020-05-01 DIAGNOSIS — M25561 Pain in right knee: Secondary | ICD-10-CM | POA: Diagnosis not present
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
## 2020-05-02 ENCOUNTER — Ambulatory Visit: Payer: Medicare Other

## 2020-05-02 DIAGNOSIS — R262 Difficulty in walking, not elsewhere classified: Secondary | ICD-10-CM | POA: Diagnosis not present

## 2020-05-02 DIAGNOSIS — M25551 Pain in right hip: Secondary | ICD-10-CM

## 2020-05-02 DIAGNOSIS — M25561 Pain in right knee: Secondary | ICD-10-CM | POA: Diagnosis not present

## 2020-05-02 DIAGNOSIS — M25562 Pain in left knee: Secondary | ICD-10-CM | POA: Diagnosis not present
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
## 2020-05-04 ENCOUNTER — Ambulatory Visit
Admission: RE | Admit: 2020-05-04 | Discharge: 2020-05-04 | Disposition: A | Discharge status: Home / Self Care | Payer: Medicare Other | Source: Ambulatory Visit | Attending: Physician Assistant | Admitting: Physician Assistant

## 2020-05-04 DIAGNOSIS — G8929 Other chronic pain: Secondary | ICD-10-CM

## 2020-05-04 DIAGNOSIS — S83281A Other tear of lateral meniscus, current injury, right knee, initial encounter: Secondary | ICD-10-CM | POA: Diagnosis not present

## 2020-05-04 DIAGNOSIS — M1711 Unilateral primary osteoarthritis, right knee: Secondary | ICD-10-CM | POA: Diagnosis not present

## 2020-05-04 DIAGNOSIS — S83241A Other tear of medial meniscus, current injury, right knee, initial encounter: Secondary | ICD-10-CM | POA: Diagnosis not present

## 2020-05-07 ENCOUNTER — Encounter: Payer: Self-pay | Admitting: Orthopaedic Surgery

## 2020-05-07 ENCOUNTER — Other Ambulatory Visit: Payer: Self-pay

## 2020-05-07 ENCOUNTER — Telehealth: Payer: Self-pay

## 2020-05-07 ENCOUNTER — Ambulatory Visit: Payer: Medicare Other

## 2020-05-07 ENCOUNTER — Ambulatory Visit (INDEPENDENT_AMBULATORY_CARE_PROVIDER_SITE_OTHER): Payer: Medicare Other | Admitting: Orthopaedic Surgery

## 2020-05-07 DIAGNOSIS — M25551 Pain in right hip: Secondary | ICD-10-CM

## 2020-05-07 DIAGNOSIS — M1712 Unilateral primary osteoarthritis, left knee: Secondary | ICD-10-CM | POA: Diagnosis not present

## 2020-05-07 DIAGNOSIS — R262 Difficulty in walking, not elsewhere classified: Secondary | ICD-10-CM

## 2020-05-07 DIAGNOSIS — M25561 Pain in right knee: Secondary | ICD-10-CM

## 2020-05-07 DIAGNOSIS — M1711 Unilateral primary osteoarthritis, right knee: Secondary | ICD-10-CM

## 2020-05-07 DIAGNOSIS — M25562 Pain in left knee: Secondary | ICD-10-CM | POA: Diagnosis not present

## 2020-05-07 NOTE — Telephone Encounter (Signed)
Bilateral knee gel injections 

## 2020-05-07 NOTE — Progress Notes (Signed)
The patient is a very active and young appearing 80 year old female who comes in to go over MRI of her right knee.  She has had chronic bilateral knee pain but the right knee has been having locking and catching as well.  She says her biggest problem is when she has been sitting for a while gets up to walk and the knees are very stiff.  She has had fluid taken off of both knees.  Surprisingly her x-rays still showed well-maintained joint space.  Physical therapy has helped her some.  She had steroid injections and that was not as helpful.  Examination of both knees today shows just a mild effusion.  She does have lateral joint line tenderness on the right knee and lateral joint tenderness on the left knee.  They are both ligamentously stable.  The MRI of the right knee does show areas of full-thickness cartilage loss especially the lateral compartment of the knee and the patellofemoral joint.  There is chronic tearing of the medial and lateral meniscus as well.  Given her MRI findings of the right knee a full-thickness cartilage loss in the lateral compartment, we are recommending hyaluronic acid for her knees to try and she would like to try this on both knees which I agree because we have seen her for both knees before.  I would not recommend an arthroscopic intervention based on the cartilage loss in light of the meniscal tearing.  She may eventually be a candidate for knee replacement surgery given her high level of activity and function and given the fact that she does not have any significant medical issues.  She agrees with this conservative treatment course for hyaluronic acid for both knees.  We will order this and call her when it is been approved and here.

## 2020-05-07 NOTE — Telephone Encounter (Signed)
Noted  
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
## 2020-05-09 ENCOUNTER — Ambulatory Visit: Payer: Medicare Other

## 2020-05-09 ENCOUNTER — Other Ambulatory Visit: Payer: Self-pay

## 2020-05-09 DIAGNOSIS — M25562 Pain in left knee: Secondary | ICD-10-CM

## 2020-05-09 DIAGNOSIS — M25561 Pain in right knee: Secondary | ICD-10-CM

## 2020-05-09 DIAGNOSIS — M25551 Pain in right hip: Secondary | ICD-10-CM | POA: Diagnosis not present

## 2020-05-09 DIAGNOSIS — R262 Difficulty in walking, not elsewhere classified: Secondary | ICD-10-CM
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
## 2020-05-13 ENCOUNTER — Encounter: Payer: Self-pay | Admitting: Physical Therapy

## 2020-05-13 ENCOUNTER — Other Ambulatory Visit: Payer: Self-pay

## 2020-05-13 ENCOUNTER — Ambulatory Visit: Payer: Medicare Other | Admitting: Physical Therapy

## 2020-05-13 DIAGNOSIS — M25551 Pain in right hip: Secondary | ICD-10-CM

## 2020-05-13 DIAGNOSIS — R262 Difficulty in walking, not elsewhere classified: Secondary | ICD-10-CM

## 2020-05-13 DIAGNOSIS — M25562 Pain in left knee: Secondary | ICD-10-CM | POA: Diagnosis not present

## 2020-05-13 DIAGNOSIS — M25561 Pain in right knee: Secondary | ICD-10-CM
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
## 2020-05-16 ENCOUNTER — Other Ambulatory Visit: Payer: Self-pay

## 2020-05-16 ENCOUNTER — Telehealth: Payer: Self-pay

## 2020-05-16 ENCOUNTER — Encounter: Payer: Self-pay | Admitting: Physical Therapy

## 2020-05-16 ENCOUNTER — Ambulatory Visit: Payer: Medicare Other | Admitting: Physical Therapy

## 2020-05-16 DIAGNOSIS — M25562 Pain in left knee: Secondary | ICD-10-CM

## 2020-05-16 DIAGNOSIS — R262 Difficulty in walking, not elsewhere classified: Secondary | ICD-10-CM

## 2020-05-16 DIAGNOSIS — M25551 Pain in right hip: Secondary | ICD-10-CM | POA: Diagnosis not present

## 2020-05-16 DIAGNOSIS — M25561 Pain in right knee: Secondary | ICD-10-CM

## 2020-05-16 NOTE — Telephone Encounter (Signed)
Submitted VOB for Synv[employer], bilateral knee.  Pending BV. 
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
## 2020-05-21 ENCOUNTER — Encounter: Payer: Self-pay | Admitting: Physical Therapy

## 2020-05-21 ENCOUNTER — Ambulatory Visit: Payer: Medicare Other | Attending: Orthopaedic Surgery | Admitting: Physical Therapy

## 2020-05-21 ENCOUNTER — Other Ambulatory Visit: Payer: Self-pay

## 2020-05-21 DIAGNOSIS — M25561 Pain in right knee: Secondary | ICD-10-CM

## 2020-05-21 DIAGNOSIS — M25551 Pain in right hip: Secondary | ICD-10-CM

## 2020-05-21 DIAGNOSIS — M25562 Pain in left knee: Secondary | ICD-10-CM

## 2020-05-21 DIAGNOSIS — R262 Difficulty in walking, not elsewhere classified: Secondary | ICD-10-CM

## 2020-05-21 HISTORY — PX: INJECTION KNEE: SHX2446
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
## 2020-05-23 ENCOUNTER — Other Ambulatory Visit: Payer: Self-pay

## 2020-05-23 ENCOUNTER — Ambulatory Visit: Payer: Medicare Other | Admitting: Physical Therapy

## 2020-05-23 ENCOUNTER — Encounter: Payer: Self-pay | Admitting: Physical Therapy

## 2020-05-23 DIAGNOSIS — M25561 Pain in right knee: Secondary | ICD-10-CM

## 2020-05-23 DIAGNOSIS — M25551 Pain in right hip: Secondary | ICD-10-CM

## 2020-05-23 DIAGNOSIS — R262 Difficulty in walking, not elsewhere classified: Secondary | ICD-10-CM

## 2020-05-23 DIAGNOSIS — M25562 Pain in left knee: Secondary | ICD-10-CM
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
## 2020-05-24 ENCOUNTER — Telehealth: Payer: Self-pay

## 2020-05-24 ENCOUNTER — Encounter: Payer: Medicare Other | Admitting: Physical Therapy

## 2020-05-24 NOTE — Telephone Encounter (Signed)
Noted! Thank you

## 2020-05-24 NOTE — Telephone Encounter (Signed)
Approved for SynviscOne, bilateral knee. [place] deductible has been met Covered at 100% through [employer] after deductible. No Co-pay No PA required

## 2020-05-24 NOTE — Telephone Encounter (Signed)
patients husband called regarding approval for gel injection patient was not around to schedule appointment husband stated she will call back later today to schedule a appointment call [phone]

## 2020-05-27 ENCOUNTER — Ambulatory Visit: Payer: Medicare Other | Admitting: Physical Therapy

## 2020-05-27 ENCOUNTER — Encounter: Payer: Self-pay | Admitting: Physical Therapy

## 2020-05-27 ENCOUNTER — Other Ambulatory Visit: Payer: Self-pay

## 2020-05-27 DIAGNOSIS — M25562 Pain in left knee: Secondary | ICD-10-CM

## 2020-05-27 DIAGNOSIS — M25551 Pain in right hip: Secondary | ICD-10-CM

## 2020-05-27 DIAGNOSIS — R262 Difficulty in walking, not elsewhere classified: Secondary | ICD-10-CM | POA: Diagnosis not present

## 2020-05-27 DIAGNOSIS — M25561 Pain in right knee: Secondary | ICD-10-CM | POA: Diagnosis not present
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
## 2020-05-30 ENCOUNTER — Ambulatory Visit: Payer: Medicare Other

## 2020-05-30 ENCOUNTER — Other Ambulatory Visit: Payer: Self-pay

## 2020-05-30 DIAGNOSIS — M25562 Pain in left knee: Secondary | ICD-10-CM | POA: Diagnosis not present

## 2020-05-30 DIAGNOSIS — R262 Difficulty in walking, not elsewhere classified: Secondary | ICD-10-CM

## 2020-05-30 DIAGNOSIS — M25561 Pain in right knee: Secondary | ICD-10-CM | POA: Diagnosis not present

## 2020-05-30 DIAGNOSIS — M25551 Pain in right hip: Secondary | ICD-10-CM | POA: Diagnosis not present
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
## 2020-06-03 ENCOUNTER — Ambulatory Visit: Payer: Medicare Other

## 2020-06-03 ENCOUNTER — Other Ambulatory Visit: Payer: Self-pay

## 2020-06-03 DIAGNOSIS — R262 Difficulty in walking, not elsewhere classified: Secondary | ICD-10-CM

## 2020-06-03 DIAGNOSIS — M25562 Pain in left knee: Secondary | ICD-10-CM | POA: Diagnosis not present

## 2020-06-03 DIAGNOSIS — M25561 Pain in right knee: Secondary | ICD-10-CM

## 2020-06-03 DIAGNOSIS — M25551 Pain in right hip: Secondary | ICD-10-CM | POA: Diagnosis not present
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
## 2020-06-04 ENCOUNTER — Ambulatory Visit (INDEPENDENT_AMBULATORY_CARE_PROVIDER_SITE_OTHER): Payer: Medicare Other | Admitting: Physician Assistant

## 2020-06-04 ENCOUNTER — Encounter: Payer: Self-pay | Admitting: Physician Assistant

## 2020-06-04 DIAGNOSIS — M1712 Unilateral primary osteoarthritis, left knee: Secondary | ICD-10-CM | POA: Diagnosis not present

## 2020-06-04 DIAGNOSIS — M1711 Unilateral primary osteoarthritis, right knee: Secondary | ICD-10-CM | POA: Diagnosis not present

## 2020-06-04 MED ORDER — LIDOCAINE HCL 1 % IJ SOLN
5.0000 mL | INTRAMUSCULAR | Status: AC | PRN
  Administered 2020-06-04: 5 mL

## 2020-06-04 MED ORDER — HYLAN G-F 20 48 MG/6ML IX SOSY
48.0000 mg | PREFILLED_SYRINGE | INTRA_ARTICULAR | Status: AC | PRN
  Administered 2020-06-04: 48 mg via INTRA_ARTICULAR

## 2020-06-04 NOTE — Progress Notes (Signed)
   Procedure Note  Patient: Madison Wall             Date of Birth: [birth year]           MRN: [id number]             Visit Date: 06/04/2020 This patient is diagnosed with osteoarthritis of the knee(s).   She comes in today for Synvisc 1 injections both knees. MRI right knee showed full-thickness cartilage loss especially the lateral compartment of the knee and the patella femoral joint.  Left knee radiograph showed mild patellofemoral changes.  This was similar findings to the right knee which also showed mild patellofemoral changes on radiographs. This patient has experienced inadequate response, adverse effects and/or intolerance with conservative treatments such as acetaminophen, NSAIDS, topical creams, physical therapy or regular exercise, knee bracing and/or weight loss.   This patient is not scheduled to have a total knee replacement within 6 months of starting treatment with viscosupplementation. She continues to have pain in both knees that affects her quality of life.  Notes that the left knee feels stiff in the right knee feels as if it gives way at times.  Bilateral knees good range of motion both knees.  Slight effusion of the right knee no effusion left knee.  No abnormal warmth erythema of either knee.  Procedures: Visit Diagnoses:  1. Unilateral primary osteoarthritis, left knee   2. Unilateral primary osteoarthritis, right knee     Large Joint Inj: bilateral knee on 06/04/2020 11:35 AM Indications: pain Details: 22 G 1.5 in needle, anterolateral approach  Arthrogram: No  Medications (Right): 5 mL lidocaine 1 %; 48 mg Hylan 48 MG/6ML Medications (Left): 5 mL lidocaine 1 %; 48 mg Hylan 48 MG/6ML Outcome: tolerated well, no immediate complications Procedure, treatment alternatives, risks and benefits explained, specific risks discussed. Consent was given by the patient. Immediately prior to procedure a time out was called to verify the correct patient, procedure,  equipment, support staff and site/side marked as required. Patient was prepped and draped in the usual sterile fashion.    Plan: She will work on quad strengthening both knees.  She does feel therapy is helping with her knees recommend she continue to do the home exercise program as taught by therapy.  We will see her back in just 8 weeks to see what type of response she had to the Synvisc 1 injections.  Patient tolerated the injections well.

## 2020-06-06 ENCOUNTER — Other Ambulatory Visit: Payer: Self-pay

## 2020-06-06 ENCOUNTER — Ambulatory Visit: Payer: Medicare Other

## 2020-06-06 DIAGNOSIS — M25561 Pain in right knee: Secondary | ICD-10-CM | POA: Diagnosis not present

## 2020-06-06 DIAGNOSIS — R262 Difficulty in walking, not elsewhere classified: Secondary | ICD-10-CM

## 2020-06-06 DIAGNOSIS — M25551 Pain in right hip: Secondary | ICD-10-CM | POA: Diagnosis not present

## 2020-06-06 DIAGNOSIS — M25562 Pain in left knee: Secondary | ICD-10-CM
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
## 2020-06-06 NOTE — Patient Instructions (Signed)
Access Code: [email or web address] URL: [email or web address] Date: 06/06/2020 Prepared by: Clarene Essex  Exercises ITB Stretch at Wall - 1 x daily - 7 x weekly - 1 sets - 3 reps - 30 sec hold

## 2020-06-10 ENCOUNTER — Other Ambulatory Visit: Payer: Self-pay

## 2020-06-10 ENCOUNTER — Ambulatory Visit: Payer: Medicare Other | Admitting: Physical Therapy

## 2020-06-10 ENCOUNTER — Encounter: Payer: Self-pay | Admitting: Physical Therapy

## 2020-06-10 DIAGNOSIS — R262 Difficulty in walking, not elsewhere classified: Secondary | ICD-10-CM | POA: Diagnosis not present

## 2020-06-10 DIAGNOSIS — M25562 Pain in left knee: Secondary | ICD-10-CM | POA: Diagnosis not present

## 2020-06-10 DIAGNOSIS — M25551 Pain in right hip: Secondary | ICD-10-CM | POA: Diagnosis not present

## 2020-06-10 DIAGNOSIS — M25561 Pain in right knee: Secondary | ICD-10-CM | POA: Diagnosis not present
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
## 2020-06-12 ENCOUNTER — Other Ambulatory Visit: Payer: Self-pay

## 2020-06-12 ENCOUNTER — Ambulatory Visit: Payer: Medicare Other

## 2020-06-12 DIAGNOSIS — M25551 Pain in right hip: Secondary | ICD-10-CM | POA: Diagnosis not present

## 2020-06-12 DIAGNOSIS — M25561 Pain in right knee: Secondary | ICD-10-CM

## 2020-06-12 DIAGNOSIS — R262 Difficulty in walking, not elsewhere classified: Secondary | ICD-10-CM

## 2020-06-12 DIAGNOSIS — M25562 Pain in left knee: Secondary | ICD-10-CM | POA: Diagnosis not present
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
## 2020-06-17 ENCOUNTER — Other Ambulatory Visit: Payer: Self-pay

## 2020-06-17 ENCOUNTER — Ambulatory Visit: Payer: Medicare Other

## 2020-06-17 DIAGNOSIS — M25561 Pain in right knee: Secondary | ICD-10-CM | POA: Diagnosis not present

## 2020-06-17 DIAGNOSIS — M25551 Pain in right hip: Secondary | ICD-10-CM | POA: Diagnosis not present

## 2020-06-17 DIAGNOSIS — R262 Difficulty in walking, not elsewhere classified: Secondary | ICD-10-CM

## 2020-06-17 DIAGNOSIS — M25562 Pain in left knee: Secondary | ICD-10-CM | POA: Diagnosis not present
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
## 2020-06-18 NOTE — Addendum Note (Signed)
Addended by: Janene Harvey D on: 06/18/2020 08:19 AM   Modules accepted: Orders

## 2020-06-20 ENCOUNTER — Encounter: Payer: Medicare Other | Admitting: Physical Therapy

## 2020-06-27 DIAGNOSIS — H0014 Chalazion left upper eyelid: Secondary | ICD-10-CM | POA: Diagnosis not present

## 2020-07-12 ENCOUNTER — Ambulatory Visit: Payer: Medicare Other

## 2020-07-22 ENCOUNTER — Ambulatory Visit: Payer: Medicare Other | Attending: Internal Medicine

## 2020-07-22 ENCOUNTER — Other Ambulatory Visit (HOSPITAL_BASED_OUTPATIENT_CLINIC_OR_DEPARTMENT_OTHER): Payer: Self-pay

## 2020-07-22 DIAGNOSIS — Z23 Encounter for immunization: Secondary | ICD-10-CM

## 2020-07-22 MED ORDER — MODERNA COVID-19 VACCINE 100 MCG/0.5ML IM SUSP
INTRAMUSCULAR | 0 refills | Status: DC
  Filled 2020-07-22: qty 0.25, 1d supply, fill #0

## 2020-07-22 NOTE — Progress Notes (Signed)
   Covid-19 Vaccination Clinic  Name:  Madison Wall    MRN: [id number] DOB: [birth year]  07/22/2020  [name] was observed post Covid-19 immunization for 15 minutes without incident. She was provided with Vaccine Information Sheet and instruction to access the V-Safe system.   [name] was instructed to call 911 with any severe reactions post vaccine: Marland Kitchen Difficulty breathing  . Swelling of face and throat  . A fast heartbeat  . A bad rash all over body  . Dizziness and weakness   Immunizations Administered    Name Date Dose VIS Date Route   Moderna Covid-19 Booster Vaccine 07/22/2020  1:54 PM 0.25 mL 01/10/2020 Intramuscular   Manufacturer: Moderna   Lot: [id number]   [place]: 11657-903-83

## 2020-07-23 ENCOUNTER — Other Ambulatory Visit (HOSPITAL_BASED_OUTPATIENT_CLINIC_OR_DEPARTMENT_OTHER): Payer: Self-pay

## 2020-07-30 ENCOUNTER — Ambulatory Visit (INDEPENDENT_AMBULATORY_CARE_PROVIDER_SITE_OTHER): Payer: Medicare Other | Admitting: Orthopaedic Surgery

## 2020-07-30 ENCOUNTER — Encounter: Payer: Self-pay | Admitting: Orthopaedic Surgery

## 2020-07-30 DIAGNOSIS — M25561 Pain in right knee: Secondary | ICD-10-CM | POA: Diagnosis not present

## 2020-07-30 DIAGNOSIS — G8929 Other chronic pain: Secondary | ICD-10-CM | POA: Diagnosis not present

## 2020-07-30 DIAGNOSIS — M25562 Pain in left knee: Secondary | ICD-10-CM | POA: Diagnosis not present

## 2020-07-30 DIAGNOSIS — M1712 Unilateral primary osteoarthritis, left knee: Secondary | ICD-10-CM

## 2020-07-30 DIAGNOSIS — M1711 Unilateral primary osteoarthritis, right knee: Secondary | ICD-10-CM | POA: Diagnosis not present

## 2020-07-30 NOTE — Progress Notes (Signed)
The patient is a very active 80 year old female who is 8 weeks into having Synvisc 1 injections placed in both her knees to treat the pain from osteoarthritis.  She says that she is doing well overall and the knees are doing great.  She denies any swelling.  She has been avoiding hills during exercise walks.  Examination of both knees shows slight valgus malalignment but good range of motion overall and no instability on exam.  Both knees do not have any pain today.  At this point follow-up can be as needed.  She knows to wait a minimum of 6 months between hyaluronic acid injections and it can be longer if she is pain-free.  We had a long thorough discussion about seeing her for her knees again if she starts having any issues.  She will let us know.  All questions and concerns were answered and addressed.

## 2020-08-06 ENCOUNTER — Ambulatory Visit (INDEPENDENT_AMBULATORY_CARE_PROVIDER_SITE_OTHER): Payer: Medicare Other | Admitting: Family

## 2020-08-06 ENCOUNTER — Encounter: Payer: Self-pay | Admitting: Family

## 2020-08-06 ENCOUNTER — Other Ambulatory Visit: Payer: Self-pay

## 2020-08-06 VITALS — BP 142/80 | HR 86 | Temp 98.3°F | Resp 15 | Ht 60.5 in | Wt 139.0 lb

## 2020-08-06 DIAGNOSIS — R030 Elevated blood-pressure reading, without diagnosis of hypertension: Secondary | ICD-10-CM

## 2020-08-06 DIAGNOSIS — F418 Other specified anxiety disorders: Secondary | ICD-10-CM | POA: Insufficient documentation

## 2020-08-06 DIAGNOSIS — K581 Irritable bowel syndrome with constipation: Secondary | ICD-10-CM

## 2020-08-06 DIAGNOSIS — R5383 Other fatigue: Secondary | ICD-10-CM | POA: Diagnosis not present

## 2020-08-06 DIAGNOSIS — Z13220 Encounter for screening for lipoid disorders: Secondary | ICD-10-CM

## 2020-08-06 DIAGNOSIS — K219 Gastro-esophageal reflux disease without esophagitis: Secondary | ICD-10-CM

## 2020-08-06 DIAGNOSIS — G4700 Insomnia, unspecified: Secondary | ICD-10-CM | POA: Diagnosis not present

## 2020-08-06 DIAGNOSIS — L719 Rosacea, unspecified: Secondary | ICD-10-CM | POA: Diagnosis not present

## 2020-08-06 DIAGNOSIS — R7309 Other abnormal glucose: Secondary | ICD-10-CM

## 2020-08-06 DIAGNOSIS — K589 Irritable bowel syndrome without diarrhea: Secondary | ICD-10-CM | POA: Insufficient documentation

## 2020-08-06 DIAGNOSIS — M810 Age-related osteoporosis without current pathological fracture: Secondary | ICD-10-CM | POA: Diagnosis not present

## 2020-08-06 DIAGNOSIS — E559 Vitamin D deficiency, unspecified: Secondary | ICD-10-CM

## 2020-08-06 DIAGNOSIS — R7303 Prediabetes: Secondary | ICD-10-CM | POA: Insufficient documentation

## 2020-08-06 LAB — LIPID PANEL
Cholesterol: 186 mg/dL (ref 0–200)
HDL: 75.5 mg/dL (ref >39.00)
LDL Cholesterol: 87 mg/dL (ref 0–99)
NonHDL: 110.28
Total CHOL/HDL Ratio: 2
Triglycerides: 116 mg/dL (ref 0.0–149.0)
VLDL: 23.2 mg/dL (ref 0.0–40.0)

## 2020-08-06 LAB — COMPREHENSIVE METABOLIC PANEL
ALT: 18 U/L (ref 0–35)
AST: 18 U/L (ref 0–37)
Albumin: 4.4 g/dL (ref 3.5–5.2)
Alkaline Phosphatase: 74 U/L (ref 39–117)
BUN: 21 mg/dL (ref 6–23)
CO2: 28 mEq/L (ref 19–32)
Calcium: 9.7 mg/dL (ref 8.4–10.5)
Chloride: 102 mEq/L (ref 96–112)
Creatinine, Ser: 0.54 mg/dL (ref 0.40–1.20)
GFR: 87.95 mL/min (ref >60.00)
Glucose, Bld: 101 mg/dL — ABNORMAL HIGH (ref 70–99)
Potassium: 4.1 mEq/L (ref 3.5–5.1)
Sodium: 138 mEq/L (ref 135–145)
Total Bilirubin: 0.3 mg/dL (ref 0.2–1.2)
Total Protein: 6.9 g/dL (ref 6.0–8.3)

## 2020-08-06 LAB — CBC
HCT: 41.5 % (ref 36.0–46.0)
Hemoglobin: 14 g/dL (ref 12.0–15.0)
MCHC: 33.8 g/dL (ref 30.0–36.0)
MCV: 90.2 fl (ref 78.0–100.0)
Platelets: 209 10*3/uL (ref 150.0–400.0)
RBC: 4.59 Mil/uL (ref 3.87–5.11)
RDW: 14 % (ref 11.5–15.5)
WBC: 5.1 10*3/uL (ref 4.0–10.5)

## 2020-08-06 LAB — VITAMIN D 25 HYDROXY (VIT D DEFICIENCY, FRACTURES): VITD: 52.21 ng/mL (ref 30.00–100.00)

## 2020-08-06 LAB — HEMOGLOBIN A1C: Hgb A1c MFr Bld: 6.1 % (ref 4.6–6.5)
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
## 2020-08-07 ENCOUNTER — Other Ambulatory Visit: Payer: Self-pay

## 2020-08-07 MED ORDER — ALBUTEROL SULFATE HFA 108 (90 BASE) MCG/ACT IN AERS: 1.0000 | INHALATION_SPRAY | Freq: Four times a day (QID) | RESPIRATORY_TRACT | 0 refills | Status: DC | PRN

## 2020-08-07 NOTE — Telephone Encounter (Signed)
I have given a pt a call and she stated that she needs a refill of her pro-air inhaler that she carries around with her for PRN use. I stated understanding and will relay the message to the provider for approval of refill.

## 2020-08-30 ENCOUNTER — Other Ambulatory Visit: Payer: Self-pay | Admitting: Family

## 2020-11-11 ENCOUNTER — Other Ambulatory Visit (HOSPITAL_BASED_OUTPATIENT_CLINIC_OR_DEPARTMENT_OTHER): Payer: Self-pay

## 2020-11-15 DIAGNOSIS — H0014 Chalazion left upper eyelid: Secondary | ICD-10-CM | POA: Diagnosis not present

## 2020-11-15 DIAGNOSIS — H02403 Unspecified ptosis of bilateral eyelids: Secondary | ICD-10-CM | POA: Diagnosis not present

## 2020-11-15 DIAGNOSIS — L719 Rosacea, unspecified: Secondary | ICD-10-CM | POA: Diagnosis not present

## 2020-11-15 DIAGNOSIS — H524 Presbyopia: Secondary | ICD-10-CM | POA: Diagnosis not present

## 2020-11-18 DIAGNOSIS — Z1231 Encounter for screening mammogram for malignant neoplasm of breast: Secondary | ICD-10-CM | POA: Diagnosis not present

## 2020-11-18 DIAGNOSIS — Z6827 Body mass index (BMI) 27.0-27.9, adult: Secondary | ICD-10-CM | POA: Diagnosis not present

## 2020-11-18 DIAGNOSIS — Z124 Encounter for screening for malignant neoplasm of cervix: Secondary | ICD-10-CM | POA: Diagnosis not present

## 2020-11-28 DIAGNOSIS — Z23 Encounter for immunization: Secondary | ICD-10-CM | POA: Diagnosis not present

## 2020-12-25 DIAGNOSIS — Z23 Encounter for immunization: Secondary | ICD-10-CM | POA: Diagnosis not present

## 2021-01-31 ENCOUNTER — Encounter: Payer: Self-pay | Admitting: Family

## 2021-02-07 ENCOUNTER — Other Ambulatory Visit: Payer: Self-pay

## 2021-02-07 ENCOUNTER — Encounter: Payer: Self-pay | Admitting: Family

## 2021-02-07 ENCOUNTER — Ambulatory Visit (INDEPENDENT_AMBULATORY_CARE_PROVIDER_SITE_OTHER): Payer: Medicare Other | Admitting: Family

## 2021-02-07 VITALS — BP 150/80 | HR 65 | Temp 97.8°F | Ht 61.0 in | Wt 145.4 lb

## 2021-02-07 DIAGNOSIS — R7303 Prediabetes: Secondary | ICD-10-CM | POA: Diagnosis not present

## 2021-02-07 DIAGNOSIS — R030 Elevated blood-pressure reading, without diagnosis of hypertension: Secondary | ICD-10-CM | POA: Diagnosis not present

## 2021-02-07 LAB — BASIC METABOLIC PANEL
BUN: 15 mg/dL (ref 6–23)
CO2: 29 mEq/L (ref 19–32)
Calcium: 9.3 mg/dL (ref 8.4–10.5)
Chloride: 103 mEq/L (ref 96–112)
Creatinine, Ser: 0.57 mg/dL (ref 0.40–1.20)
GFR: 86.51 mL/min (ref >60.00)
Glucose, Bld: 93 mg/dL (ref 70–99)
Potassium: 3.9 mEq/L (ref 3.5–5.1)
Sodium: 139 mEq/L (ref 135–145)

## 2021-02-07 LAB — HEMOGLOBIN A1C: Hgb A1c MFr Bld: 6 % (ref 4.6–6.5)

## 2021-02-07 MED ORDER — AMLODIPINE BESYLATE 2.5 MG PO TABS: ORAL_TABLET | ORAL | 1 refills | Status: DC
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
## 2021-04-08 DIAGNOSIS — Z85828 Personal history of other malignant neoplasm of skin: Secondary | ICD-10-CM | POA: Diagnosis not present

## 2021-04-08 DIAGNOSIS — D045 Carcinoma in situ of skin of trunk: Secondary | ICD-10-CM | POA: Diagnosis not present

## 2021-04-08 DIAGNOSIS — C44519 Basal cell carcinoma of skin of other part of trunk: Secondary | ICD-10-CM | POA: Diagnosis not present

## 2021-04-08 DIAGNOSIS — L570 Actinic keratosis: Secondary | ICD-10-CM | POA: Diagnosis not present

## 2021-04-08 DIAGNOSIS — D485 Neoplasm of uncertain behavior of skin: Secondary | ICD-10-CM | POA: Diagnosis not present

## 2021-04-08 DIAGNOSIS — L821 Other seborrheic keratosis: Secondary | ICD-10-CM | POA: Diagnosis not present

## 2021-04-08 DIAGNOSIS — L820 Inflamed seborrheic keratosis: Secondary | ICD-10-CM | POA: Diagnosis not present

## 2021-04-10 ENCOUNTER — Encounter: Payer: Self-pay | Admitting: Family

## 2021-04-10 ENCOUNTER — Ambulatory Visit (INDEPENDENT_AMBULATORY_CARE_PROVIDER_SITE_OTHER): Payer: Medicare Other | Admitting: Family

## 2021-04-10 VITALS — BP 160/90 | HR 69 | Temp 97.9°F | Ht 61.0 in | Wt 147.0 lb

## 2021-04-10 DIAGNOSIS — I10 Essential (primary) hypertension: Secondary | ICD-10-CM | POA: Diagnosis not present

## 2021-04-10 DIAGNOSIS — F418 Other specified anxiety disorders: Secondary | ICD-10-CM

## 2021-04-10 MED ORDER — ALPRAZOLAM 0.5 MG PO TABS: 0.5000 mg | ORAL_TABLET | Freq: Every evening | ORAL | 0 refills | Status: DC | PRN
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
## 2021-06-03 ENCOUNTER — Telehealth: Payer: Self-pay | Admitting: Physician Assistant

## 2021-06-03 ENCOUNTER — Telehealth: Payer: Self-pay | Admitting: Orthopaedic Surgery

## 2021-06-03 NOTE — Telephone Encounter (Signed)
Pt called requesting a submission for approval for gel bilateral knee. Please call pt about this matter.  Pt phone number is [phone]. ?

## 2021-06-04 NOTE — Telephone Encounter (Signed)
I will still submit. F/U may not be needed. ?

## 2021-06-12 ENCOUNTER — Encounter: Payer: Self-pay | Admitting: Family

## 2021-06-13 ENCOUNTER — Other Ambulatory Visit: Payer: Self-pay | Admitting: Family

## 2021-06-13 ENCOUNTER — Encounter: Payer: Self-pay | Admitting: Family

## 2021-06-13 MED ORDER — HYDROCHLOROTHIAZIDE 12.5 MG PO CAPS: ORAL_CAPSULE | ORAL | 0 refills | Status: DC

## 2021-06-13 MED ORDER — AMLODIPINE BESYLATE 5 MG PO TABS: 5.0000 mg | ORAL_TABLET | Freq: Every day | ORAL | 1 refills | Status: DC

## 2021-06-13 NOTE — Telephone Encounter (Signed)
Provider aware

## 2021-07-02 ENCOUNTER — Telehealth: Payer: Self-pay | Admitting: Orthopaedic Surgery

## 2021-07-02 ENCOUNTER — Telehealth: Payer: Self-pay

## 2021-07-02 ENCOUNTER — Telehealth: Payer: Self-pay | Admitting: Family

## 2021-07-02 NOTE — Telephone Encounter (Signed)
Pt called for an updated about gel injections. Please call pt with update today at [phone]. Leave detailed message if pt is unable to answer. ?

## 2021-07-02 NOTE — Telephone Encounter (Signed)
Approved for SynviscOne, bilateral knee. ?[place] ?Medicare deductible has been met ?Covered at 100% through [facility] after deductible has been met ?No Co-pay ?No PA required ? ?Appt. 4/20/203 with Erskine Emery ?

## 2021-07-02 NOTE — Telephone Encounter (Signed)
Left message for patient to call back and schedule Medicare Annual Wellness Visit (AWV). Please offer to do virtually or by telephone.  Left office number and my jabber #[phone]. ? ?Due for AWVI ? ?Please schedule at anytime with Nurse Health Advisor. ?  ?

## 2021-07-02 NOTE — Telephone Encounter (Signed)
BV pending for SynviscOne, bilateral knee. ? ?

## 2021-07-02 NOTE — Telephone Encounter (Signed)
Appt.scheduled for gel injection. ?

## 2021-07-10 ENCOUNTER — Encounter: Payer: Self-pay | Admitting: Physician Assistant

## 2021-07-10 ENCOUNTER — Ambulatory Visit (INDEPENDENT_AMBULATORY_CARE_PROVIDER_SITE_OTHER): Payer: Medicare Other | Admitting: Physician Assistant

## 2021-07-10 DIAGNOSIS — M1712 Unilateral primary osteoarthritis, left knee: Secondary | ICD-10-CM

## 2021-07-10 DIAGNOSIS — M170 Bilateral primary osteoarthritis of knee: Secondary | ICD-10-CM

## 2021-07-10 DIAGNOSIS — M1711 Unilateral primary osteoarthritis, right knee: Secondary | ICD-10-CM

## 2021-07-10 MED ORDER — LIDOCAINE HCL 1 % IJ SOLN
3.0000 mL | INTRAMUSCULAR | Status: AC | PRN
  Administered 2021-07-10: 3 mL

## 2021-07-10 MED ORDER — HYLAN G-F 20 48 MG/6ML IX SOSY
48.0000 mg | PREFILLED_SYRINGE | INTRA_ARTICULAR | Status: AC | PRN
  Administered 2021-07-10: 48 mg via INTRA_ARTICULAR

## 2021-07-10 NOTE — Progress Notes (Signed)
? ?  Procedure Note ? ?Patient: Madison Wall             ?Date of Birth: Jan 30, 1941           ?MRN: [id number]             ?Visit Date: 07/10/2021 ?HPI: Mrs. Madison Wall comes in today for Synvisc 1 injections both knees.  She has known osteoarthritis both knees.  Last Synvisc injections were given 06/04/2020.  She did well until January when her knee started bothering her.  She is having 5-8 out of 10 pain.  She states cortisone injections and were not giving her relief in the past.  She has no upcoming scheduled knee surgery on either knee in the next 6 months. ? ?Physical exam: Bilateral knees she has full range of motion of left knee.  Right knee lacks a few degrees of full extension.  Slight effusion left knee no abnormal warmth erythema or effusion of either knee.  Patellofemoral crepitus bilaterally. ? ?Procedures: ?Visit Diagnoses:  ?1. Unilateral primary osteoarthritis, left knee   ?2. Unilateral primary osteoarthritis, right knee   ? ? ?Large Joint Inj: bilateral knee on 07/10/2021 4:59 PM ?Indications: pain ?Details: 22 G 1.5 in needle, anterolateral approach ? ?Arthrogram: No ? ?Medications (Right): 3 mL lidocaine 1 %; 48 mg Hylan 48 MG/6ML ?Medications (Left): 3 mL lidocaine 1 %; 48 mg Hylan 48 MG/6ML ?Aspirate (Left): 19 mL yellow ?Outcome: tolerated well, no immediate complications ?Procedure, treatment alternatives, risks and benefits explained, specific risks discussed. Consent was given by the patient. Immediately prior to procedure a time out was called to verify the correct patient, procedure, equipment, support staff and site/side marked as required. Patient was prepped and draped in the usual sterile fashion.  ? ? ?Plan Per her request we will send her to physical therapy to work on quad strengthening both knees getting home exercise program.  Also to work on gait and balance that she states she has been problems going up and down stairs.  See her back on an as-needed basis.  She knows to wait at  least 6 months between supplemental injections. ? ? ? ?

## 2021-07-11 NOTE — Addendum Note (Signed)
Addended by: Robyne Peers on: 07/11/2021 09:14 AM ? ? Modules accepted: Orders ? ?

## 2021-07-14 ENCOUNTER — Ambulatory Visit (INDEPENDENT_AMBULATORY_CARE_PROVIDER_SITE_OTHER): Payer: Medicare Other

## 2021-07-14 VITALS — Ht 61.0 in | Wt 145.0 lb

## 2021-07-14 DIAGNOSIS — Z0000 Encounter for general adult medical examination without abnormal findings: Secondary | ICD-10-CM | POA: Diagnosis not present

## 2021-07-14 NOTE — Patient Instructions (Signed)
Ms. Mcweeney , ?Thank you for taking time to complete your Medicare Wellness Visit. I appreciate your ongoing commitment to your health goals. Please review the following plan we discussed and let me know if I can assist you in the future.  ? ?Screening recommendations/referrals: ?Colonoscopy: No longer required ?Mammogram: Per our conversation,completed within past year at GYN office. Please have copy of report sent to PCP. ?Bone Density: Per our conversation,completed within past year at GYN office. Please have copy of report sent to PCP. ?Recommended yearly ophthalmology/optometry visit for glaucoma screening and checkup ?Recommended yearly dental visit for hygiene and checkup ? ?Vaccinations: ?Influenza vaccine: Up to date ?Pneumococcal vaccine: Up to date ?Tdap vaccine: Up to date ?Shingles vaccine: Completed vaccine.   ?Covid-19:Up to date ? ?Advanced directives: Please bring a copy of Living Will and/or Healthcare Power of Attorney for your chart. ? ? ?Conditions/risks identified: See problem list ? ?Next appointment: Follow up in one year for your annual wellness visit  ? ? ?Preventive Care 36 Years and Older, Female ?Preventive care refers to lifestyle choices and visits with your health care provider that can promote health and wellness. ?What does preventive care include? ?A yearly physical exam. This is also called an annual well check. ?Dental exams once or twice a year. ?Routine eye exams. Ask your health care provider how often you should have your eyes checked. ?Personal lifestyle choices, including: ?Daily care of your teeth and gums. ?Regular physical activity. ?Eating a healthy diet. ?Avoiding tobacco and drug use. ?Limiting alcohol use. ?Practicing safe sex. ?Taking low-dose aspirin every day. ?Taking vitamin and mineral supplements as recommended by your health care provider. ?What happens during an annual well check? ?The services and screenings done by your health care provider during your  annual well check will depend on your age, overall health, lifestyle risk factors, and family history of disease. ?Counseling  ?Your health care provider may ask you questions about your: ?Alcohol use. ?Tobacco use. ?Drug use. ?Emotional well-being. ?Home and relationship well-being. ?Sexual activity. ?Eating habits. ?History of falls. ?Memory and ability to understand (cognition). ?Work and work Statistician. ?Reproductive health. ?Screening  ?You may have the following tests or measurements: ?Height, weight, and BMI. ?Blood pressure. ?Lipid and cholesterol levels. These may be checked every 5 years, or more frequently if you are over 49 years old. ?Skin check. ?Lung cancer screening. You may have this screening every year starting at age 52 if you have a 30-pack-year history of smoking and currently smoke or have quit within the past 15 years. ?Fecal occult blood test (FOBT) of the stool. You may have this test every year starting at age 26. ?Flexible sigmoidoscopy or colonoscopy. You may have a sigmoidoscopy every 5 years or a colonoscopy every 10 years starting at age 44. ?Hepatitis C blood test. ?Hepatitis B blood test. ?Sexually transmitted disease (STD) testing. ?Diabetes screening. This is done by checking your blood sugar (glucose) after you have not eaten for a while (fasting). You may have this done every 1-3 years. ?Bone density scan. This is done to screen for osteoporosis. You may have this done starting at age 44. ?Mammogram. This may be done every 1-2 years. Talk to your health care provider about how often you should have regular mammograms. ?Talk with your health care provider about your test results, treatment options, and if necessary, the need for more tests. ?Vaccines  ?Your health care provider may recommend certain vaccines, such as: ?Influenza vaccine. This is recommended every year. ?Tetanus, diphtheria,  and acellular pertussis (Tdap, Td) vaccine. You may need a Td booster every 10  years. ?Zoster vaccine. You may need this after age 92. ?Pneumococcal 13-valent conjugate (PCV13) vaccine. One dose is recommended after age 39. ?Pneumococcal polysaccharide (PPSV23) vaccine. One dose is recommended after age 18. ?Talk to your health care provider about which screenings and vaccines you need and how often you need them. ?This information is not intended to replace advice given to you by your health care provider. Make sure you discuss any questions you have with your health care provider. ?Document Released: 04/05/2015 Document Revised: 11/27/2015 Document Reviewed: 01/08/2015 ?Elsevier Interactive Patient Education ? 2017 [employer]. ? ?Fall Prevention in the Home ?Falls can cause injuries. They can happen to people of all ages. There are many things you can do to make your home safe and to help prevent falls. ?What can I do on the outside of my home? ?Regularly fix the edges of walkways and driveways and fix any cracks. ?Remove anything that might make you trip as you walk through a door, such as a raised step or threshold. ?Trim any bushes or trees on the path to your home. ?Use bright outdoor lighting. ?Clear any walking paths of anything that might make someone trip, such as rocks or tools. ?Regularly check to see if handrails are loose or broken. Make sure that both sides of any steps have handrails. ?Any raised decks and porches should have guardrails on the edges. ?Have any leaves, snow, or ice cleared regularly. ?Use sand or salt on walking paths during winter. ?Clean up any spills in your garage right away. This includes oil or grease spills. ?What can I do in the bathroom? ?Use night lights. ?Install grab bars by the toilet and in the tub and shower. Do not use towel bars as grab bars. ?Use non-skid mats or decals in the tub or shower. ?If you need to sit down in the shower, use a plastic, non-slip stool. ?Keep the floor dry. Clean up any water that spills on the floor as soon as it  happens. ?Remove soap buildup in the tub or shower regularly. ?Attach bath mats securely with double-sided non-slip rug tape. ?Do not have throw rugs and other things on the floor that can make you trip. ?What can I do in the bedroom? ?Use night lights. ?Make sure that you have a light by your bed that is easy to reach. ?Do not use any sheets or blankets that are too big for your bed. They should not hang down onto the floor. ?Have a firm chair that has side arms. You can use this for support while you get dressed. ?Do not have throw rugs and other things on the floor that can make you trip. ?What can I do in the kitchen? ?Clean up any spills right away. ?Avoid walking on wet floors. ?Keep items that you use a lot in easy-to-reach places. ?If you need to reach something above you, use a strong step stool that has a grab bar. ?Keep electrical cords out of the way. ?Do not use floor polish or wax that makes floors slippery. If you must use wax, use non-skid floor wax. ?Do not have throw rugs and other things on the floor that can make you trip. ?What can I do with my stairs? ?Do not leave any items on the stairs. ?Make sure that there are handrails on both sides of the stairs and use them. Fix handrails that are broken or loose. Make sure  that handrails are as long as the stairways. ?Check any carpeting to make sure that it is firmly attached to the stairs. Fix any carpet that is loose or worn. ?Avoid having throw rugs at the top or bottom of the stairs. If you do have throw rugs, attach them to the floor with carpet tape. ?Make sure that you have a light switch at the top of the stairs and the bottom of the stairs. If you do not have them, ask someone to add them for you. ?What else can I do to help prevent falls? ?Wear shoes that: ?Do not have high heels. ?Have rubber bottoms. ?Are comfortable and fit you well. ?Are closed at the toe. Do not wear sandals. ?If you use a stepladder: ?Make sure that it is fully opened.  Do not climb a closed stepladder. ?Make sure that both sides of the stepladder are locked into place. ?Ask someone to hold it for you, if possible. ?Clearly mark and make sure that you can see: ?Any grab bars or h
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
## 2021-07-22 DIAGNOSIS — Z23 Encounter for immunization: Secondary | ICD-10-CM | POA: Diagnosis not present
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
## 2021-07-31 ENCOUNTER — Encounter: Payer: Self-pay | Admitting: Physical Therapy

## 2021-07-31 ENCOUNTER — Other Ambulatory Visit: Payer: Self-pay

## 2021-07-31 ENCOUNTER — Ambulatory Visit: Payer: Medicare Other | Attending: Physician Assistant | Admitting: Physical Therapy

## 2021-07-31 DIAGNOSIS — M25561 Pain in right knee: Secondary | ICD-10-CM | POA: Diagnosis not present

## 2021-07-31 DIAGNOSIS — R262 Difficulty in walking, not elsewhere classified: Secondary | ICD-10-CM | POA: Insufficient documentation

## 2021-07-31 DIAGNOSIS — G8929 Other chronic pain: Secondary | ICD-10-CM | POA: Insufficient documentation

## 2021-07-31 DIAGNOSIS — M1711 Unilateral primary osteoarthritis, right knee: Secondary | ICD-10-CM | POA: Diagnosis not present

## 2021-07-31 DIAGNOSIS — M6281 Muscle weakness (generalized): Secondary | ICD-10-CM | POA: Insufficient documentation

## 2021-07-31 DIAGNOSIS — M25661 Stiffness of right knee, not elsewhere classified: Secondary | ICD-10-CM | POA: Insufficient documentation

## 2021-07-31 DIAGNOSIS — M25562 Pain in left knee: Secondary | ICD-10-CM | POA: Insufficient documentation

## 2021-07-31 DIAGNOSIS — M1712 Unilateral primary osteoarthritis, left knee: Secondary | ICD-10-CM | POA: Diagnosis not present

## 2021-08-05 ENCOUNTER — Ambulatory Visit: Payer: Medicare Other

## 2021-08-05 DIAGNOSIS — M25661 Stiffness of right knee, not elsewhere classified: Secondary | ICD-10-CM

## 2021-08-05 DIAGNOSIS — G8929 Other chronic pain: Secondary | ICD-10-CM | POA: Diagnosis not present

## 2021-08-05 DIAGNOSIS — M25561 Pain in right knee: Secondary | ICD-10-CM | POA: Diagnosis not present

## 2021-08-05 DIAGNOSIS — M25562 Pain in left knee: Secondary | ICD-10-CM | POA: Diagnosis not present

## 2021-08-05 DIAGNOSIS — M6281 Muscle weakness (generalized): Secondary | ICD-10-CM | POA: Diagnosis not present

## 2021-08-05 DIAGNOSIS — R262 Difficulty in walking, not elsewhere classified: Secondary | ICD-10-CM | POA: Diagnosis not present
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
## 2021-08-07 ENCOUNTER — Other Ambulatory Visit: Payer: Self-pay

## 2021-08-07 ENCOUNTER — Encounter: Payer: Self-pay | Admitting: Physical Therapy

## 2021-08-07 ENCOUNTER — Ambulatory Visit: Payer: Medicare Other | Admitting: Physical Therapy

## 2021-08-07 DIAGNOSIS — M25562 Pain in left knee: Secondary | ICD-10-CM

## 2021-08-07 DIAGNOSIS — R262 Difficulty in walking, not elsewhere classified: Secondary | ICD-10-CM | POA: Diagnosis not present

## 2021-08-07 DIAGNOSIS — G8929 Other chronic pain: Secondary | ICD-10-CM | POA: Diagnosis not present

## 2021-08-07 DIAGNOSIS — M6281 Muscle weakness (generalized): Secondary | ICD-10-CM | POA: Diagnosis not present

## 2021-08-07 DIAGNOSIS — M25561 Pain in right knee: Secondary | ICD-10-CM | POA: Diagnosis not present

## 2021-08-07 DIAGNOSIS — M25661 Stiffness of right knee, not elsewhere classified: Secondary | ICD-10-CM | POA: Diagnosis not present

## 2021-08-08 ENCOUNTER — Encounter: Payer: Self-pay | Admitting: Family

## 2021-08-08 ENCOUNTER — Ambulatory Visit (INDEPENDENT_AMBULATORY_CARE_PROVIDER_SITE_OTHER): Payer: Medicare Other | Admitting: Family

## 2021-08-08 VITALS — BP 130/70 | HR 66 | Temp 97.4°F | Ht 60.0 in | Wt 144.4 lb

## 2021-08-08 DIAGNOSIS — Z13220 Encounter for screening for lipoid disorders: Secondary | ICD-10-CM

## 2021-08-08 DIAGNOSIS — Z1159 Encounter for screening for other viral diseases: Secondary | ICD-10-CM | POA: Diagnosis not present

## 2021-08-08 DIAGNOSIS — E2839 Other primary ovarian failure: Secondary | ICD-10-CM

## 2021-08-08 DIAGNOSIS — Z1231 Encounter for screening mammogram for malignant neoplasm of breast: Secondary | ICD-10-CM | POA: Diagnosis not present

## 2021-08-08 DIAGNOSIS — E559 Vitamin D deficiency, unspecified: Secondary | ICD-10-CM

## 2021-08-08 DIAGNOSIS — I10 Essential (primary) hypertension: Secondary | ICD-10-CM | POA: Diagnosis not present

## 2021-08-08 DIAGNOSIS — R7309 Other abnormal glucose: Secondary | ICD-10-CM | POA: Diagnosis not present

## 2021-08-08 LAB — VITAMIN D 25 HYDROXY (VIT D DEFICIENCY, FRACTURES): VITD: 64.44 ng/mL (ref 30.00–100.00)

## 2021-08-08 LAB — COMPREHENSIVE METABOLIC PANEL
ALT: 25 U/L (ref 0–35)
AST: 23 U/L (ref 0–37)
Albumin: 4.4 g/dL (ref 3.5–5.2)
Alkaline Phosphatase: 64 U/L (ref 39–117)
BUN: 19 mg/dL (ref 6–23)
CO2: 28 mEq/L (ref 19–32)
Calcium: 10 mg/dL (ref 8.4–10.5)
Chloride: 102 mEq/L (ref 96–112)
Creatinine, Ser: 0.64 mg/dL (ref 0.40–1.20)
GFR: 83.83 mL/min (ref >60.00)
Glucose, Bld: 94 mg/dL (ref 70–99)
Potassium: 4.2 mEq/L (ref 3.5–5.1)
Sodium: 139 mEq/L (ref 135–145)
Total Bilirubin: 0.4 mg/dL (ref 0.2–1.2)
Total Protein: 7.1 g/dL (ref 6.0–8.3)

## 2021-08-08 LAB — LIPID PANEL
Cholesterol: 188 mg/dL (ref 0–200)
HDL: 80.6 mg/dL (ref >39.00)
LDL Cholesterol: 88 mg/dL (ref 0–99)
NonHDL: 107.35
Total CHOL/HDL Ratio: 2
Triglycerides: 97 mg/dL (ref 0.0–149.0)
VLDL: 19.4 mg/dL (ref 0.0–40.0)

## 2021-08-08 LAB — CBC WITH DIFFERENTIAL/PLATELET
Basophils Absolute: 0.1 10*3/uL (ref 0.0–0.1)
Basophils Relative: 0.9 % (ref 0.0–3.0)
Eosinophils Absolute: 0.3 10*3/uL (ref 0.0–0.7)
Eosinophils Relative: 5.3 % — ABNORMAL HIGH (ref 0.0–5.0)
HCT: 39.8 % (ref 36.0–46.0)
Hemoglobin: 13.1 g/dL (ref 12.0–15.0)
Lymphocytes Relative: 26.5 % (ref 12.0–46.0)
Lymphs Abs: 1.6 10*3/uL (ref 0.7–4.0)
MCHC: 33 g/dL (ref 30.0–36.0)
MCV: 90.5 fl (ref 78.0–100.0)
Monocytes Absolute: 0.5 10*3/uL (ref 0.1–1.0)
Monocytes Relative: 8.9 % (ref 3.0–12.0)
Neutro Abs: 3.4 10*3/uL (ref 1.4–7.7)
Neutrophils Relative %: 58.4 % (ref 43.0–77.0)
Platelets: 216 10*3/uL (ref 150.0–400.0)
RBC: 4.4 Mil/uL (ref 3.87–5.11)
RDW: 14.1 % (ref 11.5–15.5)
WBC: 5.9 10*3/uL (ref 4.0–10.5)

## 2021-08-08 LAB — HEMOGLOBIN A1C: Hgb A1c MFr Bld: 6 % (ref 4.6–6.5)

## 2021-08-08 MED ORDER — HYDROCHLOROTHIAZIDE 12.5 MG PO CAPS: ORAL_CAPSULE | ORAL | 3 refills | Status: DC

## 2021-08-08 MED ORDER — ALPRAZOLAM 0.5 MG PO TABS: 0.5000 mg | ORAL_TABLET | Freq: Every evening | ORAL | 0 refills | Status: DC | PRN

## 2021-08-08 MED ORDER — NYSTATIN-TRIAMCINOLONE 100000-0.1 UNIT/GM-% EX OINT: 1.0000 "application " | TOPICAL_OINTMENT | Freq: Two times a day (BID) | CUTANEOUS | 6 refills | Status: DC

## 2021-08-08 MED ORDER — AMLODIPINE BESYLATE 5 MG PO TABS: 5.0000 mg | ORAL_TABLET | Freq: Every day | ORAL | 3 refills | Status: DC

## 2021-08-08 MED ORDER — ESTROGENS CONJUGATED 0.45 MG PO TABS: 0.4500 mg | ORAL_TABLET | Freq: Every day | ORAL | 3 refills | Status: DC
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
## 2021-08-10 ENCOUNTER — Other Ambulatory Visit: Payer: Self-pay | Admitting: Family

## 2021-08-11 LAB — HEPATITIS C ANTIBODY
Hepatitis C Ab: NONREACTIVE
SIGNAL TO CUT-OFF: 0.04 (ref <1.00)

## 2021-08-12 ENCOUNTER — Ambulatory Visit: Payer: Medicare Other

## 2021-08-12 DIAGNOSIS — M25562 Pain in left knee: Secondary | ICD-10-CM | POA: Diagnosis not present

## 2021-08-12 DIAGNOSIS — M25561 Pain in right knee: Secondary | ICD-10-CM | POA: Diagnosis not present

## 2021-08-12 DIAGNOSIS — M6281 Muscle weakness (generalized): Secondary | ICD-10-CM

## 2021-08-12 DIAGNOSIS — R262 Difficulty in walking, not elsewhere classified: Secondary | ICD-10-CM

## 2021-08-12 DIAGNOSIS — M25661 Stiffness of right knee, not elsewhere classified: Secondary | ICD-10-CM

## 2021-08-12 DIAGNOSIS — G8929 Other chronic pain: Secondary | ICD-10-CM
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
## 2021-08-14 ENCOUNTER — Encounter: Payer: Self-pay | Admitting: Physical Therapy

## 2021-08-14 ENCOUNTER — Ambulatory Visit: Payer: Medicare Other | Admitting: Physical Therapy

## 2021-08-14 ENCOUNTER — Other Ambulatory Visit: Payer: Self-pay

## 2021-08-14 DIAGNOSIS — M6281 Muscle weakness (generalized): Secondary | ICD-10-CM

## 2021-08-14 DIAGNOSIS — M25562 Pain in left knee: Secondary | ICD-10-CM | POA: Diagnosis not present

## 2021-08-14 DIAGNOSIS — G8929 Other chronic pain: Secondary | ICD-10-CM

## 2021-08-14 DIAGNOSIS — M25661 Stiffness of right knee, not elsewhere classified: Secondary | ICD-10-CM | POA: Diagnosis not present

## 2021-08-14 DIAGNOSIS — R262 Difficulty in walking, not elsewhere classified: Secondary | ICD-10-CM | POA: Diagnosis not present

## 2021-08-14 DIAGNOSIS — M25561 Pain in right knee: Secondary | ICD-10-CM | POA: Diagnosis not present
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
## 2021-08-19 ENCOUNTER — Other Ambulatory Visit: Payer: Self-pay

## 2021-08-19 ENCOUNTER — Encounter: Payer: Self-pay | Admitting: Physical Therapy

## 2021-08-19 ENCOUNTER — Ambulatory Visit: Payer: Medicare Other | Admitting: Physical Therapy

## 2021-08-19 DIAGNOSIS — M25661 Stiffness of right knee, not elsewhere classified: Secondary | ICD-10-CM

## 2021-08-19 DIAGNOSIS — M25561 Pain in right knee: Secondary | ICD-10-CM

## 2021-08-19 DIAGNOSIS — M25562 Pain in left knee: Secondary | ICD-10-CM | POA: Diagnosis not present

## 2021-08-19 DIAGNOSIS — M6281 Muscle weakness (generalized): Secondary | ICD-10-CM

## 2021-08-19 DIAGNOSIS — G8929 Other chronic pain: Secondary | ICD-10-CM | POA: Diagnosis not present

## 2021-08-19 DIAGNOSIS — R262 Difficulty in walking, not elsewhere classified: Secondary | ICD-10-CM

## 2021-08-26 ENCOUNTER — Ambulatory Visit: Payer: Medicare Other | Attending: Physician Assistant

## 2021-08-26 DIAGNOSIS — R262 Difficulty in walking, not elsewhere classified: Secondary | ICD-10-CM | POA: Diagnosis not present

## 2021-08-26 DIAGNOSIS — M25661 Stiffness of right knee, not elsewhere classified: Secondary | ICD-10-CM | POA: Diagnosis not present

## 2021-08-26 DIAGNOSIS — M25562 Pain in left knee: Secondary | ICD-10-CM | POA: Diagnosis not present

## 2021-08-26 DIAGNOSIS — M6281 Muscle weakness (generalized): Secondary | ICD-10-CM | POA: Diagnosis not present

## 2021-08-26 DIAGNOSIS — G8929 Other chronic pain: Secondary | ICD-10-CM | POA: Insufficient documentation

## 2021-08-26 DIAGNOSIS — M25561 Pain in right knee: Secondary | ICD-10-CM | POA: Diagnosis not present
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
## 2021-08-28 ENCOUNTER — Encounter: Payer: Self-pay | Admitting: Physical Therapy

## 2021-08-28 ENCOUNTER — Ambulatory Visit: Payer: Medicare Other | Admitting: Physical Therapy

## 2021-08-28 DIAGNOSIS — R262 Difficulty in walking, not elsewhere classified: Secondary | ICD-10-CM | POA: Diagnosis not present

## 2021-08-28 DIAGNOSIS — M25561 Pain in right knee: Secondary | ICD-10-CM | POA: Diagnosis not present

## 2021-08-28 DIAGNOSIS — M6281 Muscle weakness (generalized): Secondary | ICD-10-CM

## 2021-08-28 DIAGNOSIS — M25661 Stiffness of right knee, not elsewhere classified: Secondary | ICD-10-CM

## 2021-08-28 DIAGNOSIS — G8929 Other chronic pain: Secondary | ICD-10-CM | POA: Diagnosis not present

## 2021-08-28 DIAGNOSIS — M25562 Pain in left knee: Secondary | ICD-10-CM | POA: Diagnosis not present
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
## 2021-09-02 ENCOUNTER — Ambulatory Visit: Payer: Medicare Other

## 2021-09-02 DIAGNOSIS — R262 Difficulty in walking, not elsewhere classified: Secondary | ICD-10-CM | POA: Diagnosis not present

## 2021-09-02 DIAGNOSIS — M6281 Muscle weakness (generalized): Secondary | ICD-10-CM

## 2021-09-02 DIAGNOSIS — M25562 Pain in left knee: Secondary | ICD-10-CM | POA: Diagnosis not present

## 2021-09-02 DIAGNOSIS — G8929 Other chronic pain: Secondary | ICD-10-CM | POA: Diagnosis not present

## 2021-09-02 DIAGNOSIS — M25661 Stiffness of right knee, not elsewhere classified: Secondary | ICD-10-CM

## 2021-09-02 DIAGNOSIS — M25561 Pain in right knee: Secondary | ICD-10-CM | POA: Diagnosis not present
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
## 2021-09-05 ENCOUNTER — Ambulatory Visit: Payer: Medicare Other

## 2021-09-05 ENCOUNTER — Other Ambulatory Visit: Payer: Self-pay | Admitting: Family

## 2021-09-05 DIAGNOSIS — R262 Difficulty in walking, not elsewhere classified: Secondary | ICD-10-CM | POA: Diagnosis not present

## 2021-09-05 DIAGNOSIS — M25561 Pain in right knee: Secondary | ICD-10-CM | POA: Diagnosis not present

## 2021-09-05 DIAGNOSIS — M25661 Stiffness of right knee, not elsewhere classified: Secondary | ICD-10-CM

## 2021-09-05 DIAGNOSIS — M6281 Muscle weakness (generalized): Secondary | ICD-10-CM

## 2021-09-05 DIAGNOSIS — M25562 Pain in left knee: Secondary | ICD-10-CM | POA: Diagnosis not present

## 2021-09-05 DIAGNOSIS — G8929 Other chronic pain: Secondary | ICD-10-CM | POA: Diagnosis not present
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
## 2021-09-07 ENCOUNTER — Other Ambulatory Visit: Payer: Self-pay | Admitting: Family

## 2021-09-09 ENCOUNTER — Encounter: Payer: Self-pay | Admitting: Physical Therapy

## 2021-09-09 ENCOUNTER — Ambulatory Visit: Payer: Medicare Other | Admitting: Physical Therapy

## 2021-09-09 DIAGNOSIS — R262 Difficulty in walking, not elsewhere classified: Secondary | ICD-10-CM

## 2021-09-09 DIAGNOSIS — M6281 Muscle weakness (generalized): Secondary | ICD-10-CM | POA: Diagnosis not present

## 2021-09-09 DIAGNOSIS — M25661 Stiffness of right knee, not elsewhere classified: Secondary | ICD-10-CM

## 2021-09-09 DIAGNOSIS — G8929 Other chronic pain: Secondary | ICD-10-CM | POA: Diagnosis not present

## 2021-09-09 DIAGNOSIS — M25562 Pain in left knee: Secondary | ICD-10-CM | POA: Diagnosis not present

## 2021-09-09 DIAGNOSIS — M25561 Pain in right knee: Secondary | ICD-10-CM | POA: Diagnosis not present
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
## 2021-09-16 ENCOUNTER — Encounter: Payer: Self-pay | Admitting: Physical Therapy

## 2021-09-16 ENCOUNTER — Ambulatory Visit: Payer: Medicare Other | Admitting: Physical Therapy

## 2021-09-16 DIAGNOSIS — M25562 Pain in left knee: Secondary | ICD-10-CM | POA: Diagnosis not present

## 2021-09-16 DIAGNOSIS — M25661 Stiffness of right knee, not elsewhere classified: Secondary | ICD-10-CM | POA: Diagnosis not present

## 2021-09-16 DIAGNOSIS — R262 Difficulty in walking, not elsewhere classified: Secondary | ICD-10-CM

## 2021-09-16 DIAGNOSIS — M6281 Muscle weakness (generalized): Secondary | ICD-10-CM | POA: Diagnosis not present

## 2021-09-16 DIAGNOSIS — G8929 Other chronic pain: Secondary | ICD-10-CM

## 2021-09-16 DIAGNOSIS — M25561 Pain in right knee: Secondary | ICD-10-CM | POA: Diagnosis not present

## 2021-09-18 ENCOUNTER — Ambulatory Visit: Payer: Medicare Other | Admitting: Physical Therapy

## 2021-09-18 ENCOUNTER — Encounter: Payer: Self-pay | Admitting: Physical Therapy

## 2021-09-18 DIAGNOSIS — R262 Difficulty in walking, not elsewhere classified: Secondary | ICD-10-CM | POA: Diagnosis not present

## 2021-09-18 DIAGNOSIS — M6281 Muscle weakness (generalized): Secondary | ICD-10-CM | POA: Diagnosis not present

## 2021-09-18 DIAGNOSIS — M25661 Stiffness of right knee, not elsewhere classified: Secondary | ICD-10-CM | POA: Diagnosis not present

## 2021-09-18 DIAGNOSIS — G8929 Other chronic pain: Secondary | ICD-10-CM

## 2021-09-18 DIAGNOSIS — M25561 Pain in right knee: Secondary | ICD-10-CM | POA: Diagnosis not present

## 2021-09-18 DIAGNOSIS — M25562 Pain in left knee: Secondary | ICD-10-CM | POA: Diagnosis not present
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
## 2021-09-19 ENCOUNTER — Encounter: Payer: Self-pay | Admitting: Family

## 2021-09-19 ENCOUNTER — Other Ambulatory Visit: Payer: Self-pay | Admitting: Family

## 2021-09-19 MED ORDER — AMOXICILLIN-POT CLAVULANATE 875-125 MG PO TABS: 1.0000 | ORAL_TABLET | Freq: Two times a day (BID) | ORAL | 0 refills | Status: AC

## 2021-09-19 MED ORDER — FLUCONAZOLE 150 MG PO TABS: ORAL_TABLET | ORAL | 0 refills | Status: DC

## 2021-11-11 ENCOUNTER — Other Ambulatory Visit: Payer: Self-pay | Admitting: Family

## 2021-11-11 NOTE — Telephone Encounter (Signed)
Requesting: alprazolam 0.'5mg'$   Contract: None UDS: None Last Visit: 08/08/21 Next Visit: 08/11/22  Last Refill: 08/08/21 #90 and 0RF  Please Advise

## 2021-11-25 ENCOUNTER — Other Ambulatory Visit: Payer: Self-pay | Admitting: Family

## 2021-11-25 DIAGNOSIS — Z111 Encounter for screening for respiratory tuberculosis: Secondary | ICD-10-CM

## 2021-11-27 ENCOUNTER — Other Ambulatory Visit (INDEPENDENT_AMBULATORY_CARE_PROVIDER_SITE_OTHER): Payer: Medicare Other

## 2021-11-27 DIAGNOSIS — Z111 Encounter for screening for respiratory tuberculosis: Secondary | ICD-10-CM

## 2021-11-29 LAB — QUANTIFERON-TB GOLD PLUS
Mitogen-NIL: >10 IU/mL
NIL: 0.07 IU/mL
QuantiFERON-TB Gold Plus: NEGATIVE
TB1-NIL: <0 IU/mL
TB2-NIL: <0 IU/mL

## 2021-11-30 DIAGNOSIS — Z23 Encounter for immunization: Secondary | ICD-10-CM | POA: Diagnosis not present

## 2021-12-02 ENCOUNTER — Other Ambulatory Visit: Payer: Self-pay | Admitting: Family

## 2021-12-02 ENCOUNTER — Encounter: Payer: Self-pay | Admitting: Family

## 2021-12-02 ENCOUNTER — Telehealth: Payer: Self-pay | Admitting: Family

## 2021-12-02 DIAGNOSIS — R350 Frequency of micturition: Secondary | ICD-10-CM

## 2021-12-02 NOTE — Telephone Encounter (Signed)
Please print her labs from 08/08/2021 including CBC, CMP and U/A; need TB gold printed from 11/2021;  Can you also please print her snap shot and copy of OV note from 08/08/2021?  Thanks-

## 2021-12-03 NOTE — Telephone Encounter (Signed)
I have spoken to the pt and informed her that we have completed her form for [employer] as well. We are missing the UA and she is not on the Lab visit to get it done tomorrow. Then once the results gets back then I will send a fax to [facility] and mail the packet to then with her husbands packet. Urine cup up front for pick up.

## 2021-12-03 NOTE — Telephone Encounter (Signed)
Information has been printed and we are not awaiting for pt to come into the office to do a U/A since that is what is needed and she has not done one in the last 6 months. I have called the pt and left a message to call back and a detailed message stating that we will need a urinary sample and she can come by to give [place] one since laura has put in the order for future already.

## 2021-12-04 ENCOUNTER — Encounter: Payer: Self-pay | Admitting: Family

## 2021-12-04 ENCOUNTER — Other Ambulatory Visit (INDEPENDENT_AMBULATORY_CARE_PROVIDER_SITE_OTHER): Payer: Medicare Other

## 2021-12-04 DIAGNOSIS — R350 Frequency of micturition: Secondary | ICD-10-CM

## 2021-12-04 LAB — URINALYSIS
Bilirubin Urine: NEGATIVE
Hgb urine dipstick: NEGATIVE
Ketones, ur: NEGATIVE
Leukocytes,Ua: NEGATIVE
Nitrite: NEGATIVE
Specific Gravity, Urine: <=1.005 — AB (ref 1.000–1.030)
Total Protein, Urine: NEGATIVE
Urine Glucose: NEGATIVE
Urobilinogen, UA: 0.2 (ref 0.0–1.0)
pH: 5.5 (ref 5.0–8.0)

## 2021-12-14 DIAGNOSIS — Z23 Encounter for immunization: Secondary | ICD-10-CM | POA: Diagnosis not present

## 2021-12-17 DIAGNOSIS — D485 Neoplasm of uncertain behavior of skin: Secondary | ICD-10-CM | POA: Diagnosis not present

## 2021-12-17 DIAGNOSIS — L570 Actinic keratosis: Secondary | ICD-10-CM | POA: Diagnosis not present

## 2021-12-17 DIAGNOSIS — Z85828 Personal history of other malignant neoplasm of skin: Secondary | ICD-10-CM | POA: Diagnosis not present

## 2022-01-09 DIAGNOSIS — H0100A Unspecified blepharitis right eye, upper and lower eyelids: Secondary | ICD-10-CM | POA: Diagnosis not present

## 2022-01-09 DIAGNOSIS — H0100B Unspecified blepharitis left eye, upper and lower eyelids: Secondary | ICD-10-CM | POA: Diagnosis not present

## 2022-01-09 DIAGNOSIS — H5203 Hypermetropia, bilateral: Secondary | ICD-10-CM | POA: Diagnosis not present

## 2022-01-28 ENCOUNTER — Other Ambulatory Visit: Payer: Medicare Other

## 2022-01-28 ENCOUNTER — Ambulatory Visit: Payer: Medicare Other

## 2022-02-17 ENCOUNTER — Other Ambulatory Visit: Payer: Self-pay | Admitting: Family

## 2022-04-07 DIAGNOSIS — D1801 Hemangioma of skin and subcutaneous tissue: Secondary | ICD-10-CM | POA: Diagnosis not present

## 2022-04-07 DIAGNOSIS — L821 Other seborrheic keratosis: Secondary | ICD-10-CM | POA: Diagnosis not present

## 2022-04-07 DIAGNOSIS — L570 Actinic keratosis: Secondary | ICD-10-CM | POA: Diagnosis not present

## 2022-04-07 DIAGNOSIS — D0472 Carcinoma in situ of skin of left lower limb, including hip: Secondary | ICD-10-CM | POA: Diagnosis not present

## 2022-04-07 DIAGNOSIS — D485 Neoplasm of uncertain behavior of skin: Secondary | ICD-10-CM | POA: Diagnosis not present

## 2022-04-07 DIAGNOSIS — Z85828 Personal history of other malignant neoplasm of skin: Secondary | ICD-10-CM | POA: Diagnosis not present

## 2022-05-12 ENCOUNTER — Ambulatory Visit
Admission: RE | Admit: 2022-05-12 | Discharge: 2022-05-12 | Disposition: A | Discharge status: Home / Self Care | Payer: Medicare Other | Source: Ambulatory Visit | Attending: Family | Admitting: Family

## 2022-05-12 ENCOUNTER — Other Ambulatory Visit: Payer: Self-pay | Admitting: Family

## 2022-05-12 DIAGNOSIS — E2839 Other primary ovarian failure: Secondary | ICD-10-CM

## 2022-05-12 DIAGNOSIS — Z780 Asymptomatic menopausal state: Secondary | ICD-10-CM | POA: Diagnosis not present

## 2022-05-12 DIAGNOSIS — Z1231 Encounter for screening mammogram for malignant neoplasm of breast: Secondary | ICD-10-CM | POA: Diagnosis not present

## 2022-05-12 DIAGNOSIS — M8588 Other specified disorders of bone density and structure, other site: Secondary | ICD-10-CM | POA: Diagnosis not present

## 2022-05-12 DIAGNOSIS — M810 Age-related osteoporosis without current pathological fracture: Secondary | ICD-10-CM | POA: Diagnosis not present

## 2022-05-26 ENCOUNTER — Other Ambulatory Visit: Payer: Self-pay | Admitting: Family

## 2022-05-28 ENCOUNTER — Encounter: Payer: Self-pay | Admitting: Radiology

## 2022-06-25 ENCOUNTER — Telehealth: Payer: Self-pay | Admitting: Physician Assistant

## 2022-06-25 NOTE — Telephone Encounter (Signed)
VOB submitted for Monovisc, bilateral knee  

## 2022-06-25 NOTE — Telephone Encounter (Signed)
Patient requesting Bil knee Gel injection [facility]visc[facility] had them done 07/11/22 please advise

## 2022-07-03 ENCOUNTER — Telehealth: Payer: Self-pay | Admitting: Family

## 2022-07-03 NOTE — Telephone Encounter (Signed)
Copied from CRM [id number]. Topic: Medicare AWV >> Jul 03, 2022  2:56 PM Payton Doughty wrote: Reason for CRM: Called patient to schedule Medicare Annual Wellness Visit (AWV). No voicemail available to leave a message.  Last date of AWV: 07/14/21  Please schedule an appointment at any time with Donne Anon, CMA  .  If any questions, please contact me.  Thank you ,  Verlee Rossetti; Care Guide Ambulatory Clinical Support [place] [facility] Direct Dial: [phone]

## 2022-07-06 DIAGNOSIS — Z800 Family history of malignant neoplasm of digestive organs: Secondary | ICD-10-CM | POA: Diagnosis not present

## 2022-07-06 DIAGNOSIS — K5900 Constipation, unspecified: Secondary | ICD-10-CM | POA: Diagnosis not present

## 2022-07-23 ENCOUNTER — Ambulatory Visit (INDEPENDENT_AMBULATORY_CARE_PROVIDER_SITE_OTHER): Payer: Medicare Other | Admitting: Family Medicine

## 2022-07-23 ENCOUNTER — Encounter: Payer: Self-pay | Admitting: Family Medicine

## 2022-07-23 VITALS — BP 129/66 | HR 80 | Temp 97.8°F | Ht 60.0 in

## 2022-07-23 DIAGNOSIS — R051 Acute cough: Secondary | ICD-10-CM | POA: Diagnosis not present

## 2022-07-23 DIAGNOSIS — J0140 Acute pansinusitis, unspecified: Secondary | ICD-10-CM | POA: Diagnosis not present

## 2022-07-23 MED ORDER — AMOXICILLIN-POT CLAVULANATE 875-125 MG PO TABS: 1.0000 | ORAL_TABLET | Freq: Two times a day (BID) | ORAL | 0 refills | Status: DC

## 2022-07-23 MED ORDER — FLUCONAZOLE 150 MG PO TABS: 150.0000 mg | ORAL_TABLET | Freq: Every day | ORAL | 0 refills | Status: DC

## 2022-07-23 MED ORDER — ALBUTEROL SULFATE HFA 108 (90 BASE) MCG/ACT IN AERS: INHALATION_SPRAY | RESPIRATORY_TRACT | 3 refills | Status: DC

## 2022-07-23 MED ORDER — GUAIFENESIN ER 600 MG PO TB12: 1200.0000 mg | ORAL_TABLET | Freq: Two times a day (BID) | ORAL | 0 refills | Status: DC

## 2022-07-23 MED ORDER — BENZONATATE 100 MG PO CAPS: 100.0000 mg | ORAL_CAPSULE | Freq: Two times a day (BID) | ORAL | 0 refills | Status: DC | PRN

## 2022-07-23 NOTE — Patient Instructions (Signed)
Start Augmentin. Adding Diflucan in case you get a yeast infection.  Adding Mucinex and Tessalon as well.  Refilling your inhaler.  Continue supportive measures including rest, hydration, humidifier use, steam showers, warm compresses to sinuses, warm liquids with lemon and honey, and over-the-counter cough, cold, and analgesics as needed.   Please contact office for follow-up if symptoms do not improve or worsen. Seek emergency care if symptoms become severe.

## 2022-07-23 NOTE — Progress Notes (Signed)
Acute Office Visit  Subjective:     Patient ID: Madison Wall, female    DOB: Aug 14, 1940, 82 y.o.   MRN: [id number]  Chief Complaint  Patient presents with   Nasal Congestion    Onset one week  Green mucus  COVID negative 07/22/2022    HPI  Patietn is here with recent URI and worsening cough.  Upper Respiratory Infection: Patient complains of symptoms of a URI. Symptoms include  congestion, cough (green sputum), sinus pressure, nasal congestion, rhinorrhea ,headache, ear pressure . Onset of symptoms was 1 week ago, gradually worsening since that time.   She is drinking plenty of fluids. Evaluation to date: none. Treatment to date:  Zyrtec, benadryl, albuterol . She denies fevers, chills, body aches, chest pain, wheezing, GI/GU symptoms.      ROS All review of systems negative except what is listed in the HPI      Objective:    BP 129/66 (BP Location: Left Arm, Patient Position: Sitting, Cuff Size: Large)   Pulse 80   Temp 97.8 F (36.6 C) (Oral)   Ht 5' (1.524 m)   SpO2 97%   BMI 28.20 kg/m    Physical Exam Vitals reviewed.  Constitutional:      Appearance: Normal appearance.  HENT:     Head: Normocephalic and atraumatic.     Nose: Congestion present.     Mouth/Throat:     Mouth: Mucous membranes are moist.  Eyes:     Conjunctiva/sclera: Conjunctivae normal.  Cardiovascular:     Rate and Rhythm: Normal rate and regular rhythm.     Pulses: Normal pulses.     Heart sounds: Normal heart sounds.  Pulmonary:     Effort: Pulmonary effort is normal.     Breath sounds: Normal breath sounds. No wheezing, rhonchi or rales.     Comments: Productive cough Musculoskeletal:     Cervical back: Normal range of motion and neck supple.  Skin:    General: Skin is warm and dry.  Neurological:     Mental Status: She is alert and oriented to person, place, and time.  Psychiatric:        Mood and Affect: Mood normal.        Behavior: Behavior normal.        Thought  Content: Thought content normal.        Judgment: Judgment normal.     No results found for any visits on 07/23/22.      Assessment & Plan:   Problem List Items Addressed This Visit   None Visit Diagnoses     Acute non-recurrent pansinusitis    -  Primary   Relevant Medications   fluconazole (DIFLUCAN) 150 MG tablet   amoxicillin-clavulanate (AUGMENTIN) 875-125 MG tablet   benzonatate (TESSALON) 100 MG capsule   guaiFENesin (MUCINEX) 600 MG 12 hr tablet   Acute cough       Relevant Medications   albuterol (VENTOLIN HFA) 108 (90 Base) MCG/ACT inhaler   amoxicillin-clavulanate (AUGMENTIN) 875-125 MG tablet   benzonatate (TESSALON) 100 MG capsule   guaiFENesin (MUCINEX) 600 MG 12 hr tablet      Start Augmentin. Adding Diflucan in case you get a yeast infection.  Adding Mucinex and Tessalon as well.  Refilling your inhaler.  Continue supportive measures including rest, hydration, humidifier use, steam showers, warm compresses to sinuses, warm liquids with lemon and honey, and over-the-counter cough, cold, and analgesics as needed.  Patient aware of signs/symptoms requiring further/urgent evaluation.  Meds ordered this encounter  Medications   albuterol (VENTOLIN HFA) 108 (90 Base) MCG/ACT inhaler    Sig: INHALE 1 TO 2 PUFFS INTO THE LUNGS EVERY 6 (SIX) HOURS AS NEEDED FOR WHEEZING OR SHORTNESS OF BREATH (PRO AIR INHALER).    Dispense:  18 each    Refill:  3    Order Specific Question:   Supervising Provider    Answer:   Danise Edge A [[id number]]   fluconazole (DIFLUCAN) 150 MG tablet    Sig: Take 1 tablet (150 mg total) by mouth daily. May repeat in 3 days if needed.    Dispense:  2 tablet    Refill:  0    Order Specific Question:   Supervising Provider    Answer:   Danise Edge A [[id number]]   amoxicillin-clavulanate (AUGMENTIN) 875-125 MG tablet    Sig: Take 1 tablet by mouth 2 (two) times daily.    Dispense:  20 tablet    Refill:  0    Order Specific Question:    Supervising Provider    Answer:   Danise Edge A [[id number]]   benzonatate (TESSALON) 100 MG capsule    Sig: Take 1 capsule (100 mg total) by mouth 2 (two) times daily as needed for cough.    Dispense:  20 capsule    Refill:  0    Order Specific Question:   Supervising Provider    Answer:   Danise Edge A [[id number]]   guaiFENesin (MUCINEX) 600 MG 12 hr tablet    Sig: Take 2 tablets (1,200 mg total) by mouth 2 (two) times daily.    Dispense:  30 tablet    Refill:  0    Order Specific Question:   Supervising Provider    Answer:   Danise Edge A [[id number]]    Return if symptoms worsen or fail to improve.  Clayborne Dana, NP

## 2022-08-04 ENCOUNTER — Telehealth: Payer: Self-pay | Admitting: Orthopaedic Surgery

## 2022-08-04 NOTE — Telephone Encounter (Signed)
Patient spouse called  asked if the gel injection has been approved for both knees. The number to contact patient is [phone]

## 2022-08-05 ENCOUNTER — Other Ambulatory Visit: Payer: Self-pay

## 2022-08-05 DIAGNOSIS — M1712 Unilateral primary osteoarthritis, left knee: Secondary | ICD-10-CM

## 2022-08-05 DIAGNOSIS — M1711 Unilateral primary osteoarthritis, right knee: Secondary | ICD-10-CM

## 2022-08-05 NOTE — Telephone Encounter (Signed)
Talked with patient and appointment has been scheduled.  

## 2022-08-11 ENCOUNTER — Encounter: Payer: Self-pay | Admitting: Family

## 2022-08-11 ENCOUNTER — Ambulatory Visit (INDEPENDENT_AMBULATORY_CARE_PROVIDER_SITE_OTHER): Payer: Medicare Other | Admitting: Family

## 2022-08-11 VITALS — BP 130/78 | HR 75 | Ht 60.0 in | Wt 145.0 lb

## 2022-08-11 DIAGNOSIS — I10 Essential (primary) hypertension: Secondary | ICD-10-CM | POA: Diagnosis not present

## 2022-08-11 DIAGNOSIS — Z13220 Encounter for screening for lipoid disorders: Secondary | ICD-10-CM

## 2022-08-11 DIAGNOSIS — R7303 Prediabetes: Secondary | ICD-10-CM | POA: Diagnosis not present

## 2022-08-11 DIAGNOSIS — E559 Vitamin D deficiency, unspecified: Secondary | ICD-10-CM | POA: Diagnosis not present

## 2022-08-11 DIAGNOSIS — J9801 Acute bronchospasm: Secondary | ICD-10-CM

## 2022-08-11 LAB — COMPREHENSIVE METABOLIC PANEL
ALT: 19 U/L (ref 0–35)
AST: 18 U/L (ref 0–37)
Albumin: 4.1 g/dL (ref 3.5–5.2)
Alkaline Phosphatase: 72 U/L (ref 39–117)
BUN: 16 mg/dL (ref 6–23)
CO2: 27 mEq/L (ref 19–32)
Calcium: 9.2 mg/dL (ref 8.4–10.5)
Chloride: 103 mEq/L (ref 96–112)
Creatinine, Ser: 0.54 mg/dL (ref 0.40–1.20)
GFR: 86.72 mL/min (ref >60.00)
Glucose, Bld: 96 mg/dL (ref 70–99)
Potassium: 4.2 mEq/L (ref 3.5–5.1)
Sodium: 139 mEq/L (ref 135–145)
Total Bilirubin: 0.4 mg/dL (ref 0.2–1.2)
Total Protein: 6.6 g/dL (ref 6.0–8.3)

## 2022-08-11 LAB — CBC WITH DIFFERENTIAL/PLATELET
Basophils Absolute: 0 10*3/uL (ref 0.0–0.1)
Basophils Relative: 0.6 % (ref 0.0–3.0)
Eosinophils Absolute: 0.2 10*3/uL (ref 0.0–0.7)
Eosinophils Relative: 3.8 % (ref 0.0–5.0)
HCT: 40.1 % (ref 36.0–46.0)
Hemoglobin: 13 g/dL (ref 12.0–15.0)
Lymphocytes Relative: 35.5 % (ref 12.0–46.0)
Lymphs Abs: 1.5 10*3/uL (ref 0.7–4.0)
MCHC: 32.4 g/dL (ref 30.0–36.0)
MCV: 91 fl (ref 78.0–100.0)
Monocytes Absolute: 0.3 10*3/uL (ref 0.1–1.0)
Monocytes Relative: 7.1 % (ref 3.0–12.0)
Neutro Abs: 2.2 10*3/uL (ref 1.4–7.7)
Neutrophils Relative %: 53 % (ref 43.0–77.0)
Platelets: 215 10*3/uL (ref 150.0–400.0)
RBC: 4.41 Mil/uL (ref 3.87–5.11)
RDW: 14 % (ref 11.5–15.5)
WBC: 4.2 10*3/uL (ref 4.0–10.5)

## 2022-08-11 LAB — LIPID PANEL
Cholesterol: 181 mg/dL (ref 0–200)
HDL: 74.8 mg/dL (ref >39.00)
LDL Cholesterol: 91 mg/dL (ref 0–99)
NonHDL: 105.77
Total CHOL/HDL Ratio: 2
Triglycerides: 73 mg/dL (ref 0.0–149.0)
VLDL: 14.6 mg/dL (ref 0.0–40.0)

## 2022-08-11 LAB — HEMOGLOBIN A1C: Hgb A1c MFr Bld: 6.1 % (ref 4.6–6.5)

## 2022-08-11 LAB — VITAMIN D 25 HYDROXY (VIT D DEFICIENCY, FRACTURES): VITD: 71.24 ng/mL (ref 30.00–100.00)

## 2022-08-11 MED ORDER — AMLODIPINE BESYLATE 5 MG PO TABS: 5.0000 mg | ORAL_TABLET | Freq: Every day | ORAL | 3 refills | Status: DC

## 2022-08-11 MED ORDER — PREDNISONE 20 MG PO TABS: 20.0000 mg | ORAL_TABLET | Freq: Every day | ORAL | 0 refills | Status: DC
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
## 2022-08-11 NOTE — Patient Instructions (Signed)
Please call the GI to clarify the question about having the colonoscopy since we are using the prednisone to fix the cough;

## 2022-08-12 ENCOUNTER — Other Ambulatory Visit: Payer: Self-pay | Admitting: Family

## 2022-08-12 NOTE — Telephone Encounter (Signed)
Requesting: alprazolam 0.5mg   Contract: None UDS: None Last Visit: 08/11/22 Next Visit: 08/12/23 Last Refill: 05/26/22 #90 and 0RF   Please Advise
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
## 2022-08-14 ENCOUNTER — Other Ambulatory Visit: Payer: Self-pay

## 2022-08-14 ENCOUNTER — Encounter: Payer: Self-pay | Admitting: Physical Therapy

## 2022-08-14 ENCOUNTER — Ambulatory Visit: Payer: Medicare Other | Attending: Obstetrics and Gynecology | Admitting: Physical Therapy

## 2022-08-14 DIAGNOSIS — R102 Pelvic and perineal pain: Secondary | ICD-10-CM | POA: Insufficient documentation

## 2022-08-14 DIAGNOSIS — R278 Other lack of coordination: Secondary | ICD-10-CM | POA: Diagnosis not present

## 2022-08-14 DIAGNOSIS — M6281 Muscle weakness (generalized): Secondary | ICD-10-CM | POA: Diagnosis not present

## 2022-08-14 NOTE — Patient Instructions (Signed)
Lubrication Used for intercourse to reduce friction Avoid ones that have glycerin, nonoxynol-9, petroleum, propylene glycol, chlorhexidine gluconate, warming gels, tingling gels, icing or cooling gel, scented Avoid parabens due to a preservative similar to female sex hormone May need to be reapplied once or several times during sexual activity Can be applied to both partners genitals prior to vaginal penetration to minimize friction or irritation Prevent irritation and mucosal tears that cause post coital pain and increased the risk of vaginal and urinary tract infections Oil-based lubricants cannot be used with condoms due to breaking them down.  Least likely to irritate vaginal tissue.  Plant based-lubes are safe Silicone-based lubrication are thicker and last long and used for post-menopausal women  Vaginal Lubricators Here is a list of some suggested lubricators you can use for intercourse. Use the most hypoallergenic product.  You can place on you or your partner.  Slippery Stuff ( water based) Sylk or Sliquid Natural H2O ( good  if frequent UTI's)- walmart, amazon Sliquid organics silk-(aloe and silicone based ) [employer] (www.blossom-organics.com)- (aloe based ) Coconut oil, olive oil -not good with condoms  PJur Woman Nude- (water based) amazon Uberlube- ( silicon) Amazon Aloe Vera- Sprouts has an organic one Yes lubricant- (water based and has plant oil based similar to silicone) [employer] Platinum-Silicone, Target, [facility] Olive and Bee intimate cream-  www.oliveandbee.com.au Pink - [employer] Erosense Sync- walmart, amazon Coconu- coconu.com Desert Northwest [employer] to avoid in lubricants are glycerin, warming gels, tingling gels, icing or cooling  gels, and scented gels.  Also avoid Vaseline. KY jelly,  and Astroglide contain chlorhexidine which kills good bacteria(lactobacilli)  Things to avoid in the vaginal area Do not use things to irritate the  vulvar area No lotions- see below Soaps you  can use :Aveeno, Calendula, Good Clean Love cleanser if needed. Must be gentle No deodorants No douches Good to sleep without underwear to let the vaginal area to air out No scrubbing: spread the lips to let warm water rinse over labias and pat dry  Creams that can be used on the Vulva Area V [employer], walmart Vital V Wild Yam Salve Julva- [employer] Botanical Pro-Meno Wild Yam Cream Coconut oil, olive oil Cleo by [employer] labial moisturizer -Amazon,  Desert Baird Releveum ( lidocaine) or Desert [employer] Corporation Yes Moisturizer  Moisturizers They are used in the vagina to hydrate the mucous membrane that make up the vaginal canal. Designed to keep a more normal acid balance (ph) Once placed in the vagina, it will last between two to three days.  Use 2-3 times per week at bedtime  Ingredients to avoid is glycerin and fragrance, can increase chance of infection Should not be used just before sex due to causing irritation Most are gels administered either in a tampon-shaped applicator or as a vaginal suppository. They are non-hormonal.   Types of Moisturizers(internal use)  Vitamin E vaginal suppositories- Whole foods, Amazon Moist Again Coconut oil- can break down condoms Julva- (Do no use if on Tamoxifen) amazon Yes moisturizer- amazon NeuEve Silk , NeuEve Silver for menopausal or over 65 (if have severe vaginal atrophy or cancer treatments use NeuEve Silk for  1 month than move to [employer])- [employer], [place].com Olive and Bee intimate cream- www.oliveandbee.com.au Mae vaginal moisturizer- Amazon Aloe Good Clean Love Hyaluronic acid Hyalofemme replens   Creams to use externally on the Vulva area Marathon Oil (good for for cancer patients that had radiation to the area)- [place] or [employer].https://garcia-valdez.org/  V-magic cream - amazon Julva-amazon Vital "V Wild Yam salve ( help moisturize and help with thinning vulvar  area, does have Beeswax MoodMaid Botanical Pro-Meno Wild Yam Cream- Amazon Desert Harvest Gele Cleo by Zane Herald labial moisturizer (Amazon,  Coconut or olive oil aloe Good Clean Love Enchanted Rose by intimate rose  Things to avoid in the vaginal area Do not use things to irritate the vulvar area No lotions just specialized creams for the vulva area- Neogyn, V-magic, No soaps; can use Aveeno or Calendula cleanser if needed. Must be gentle No deodorants No douches Good to sleep without underwear to let the vaginal area to air out No scrubbing: spread the lips to let warm water rinse over labias and pat dry       [facility] [address] [place], [place] [address] Phone # [phone] Fax [phone]

## 2022-08-19 DIAGNOSIS — K5730 Diverticulosis of large intestine without perforation or abscess without bleeding: Secondary | ICD-10-CM | POA: Diagnosis not present

## 2022-08-19 DIAGNOSIS — K648 Other hemorrhoids: Secondary | ICD-10-CM | POA: Diagnosis not present

## 2022-08-19 DIAGNOSIS — R194 Change in bowel habit: Secondary | ICD-10-CM | POA: Diagnosis not present

## 2022-08-24 ENCOUNTER — Ambulatory Visit: Payer: Medicare Other | Attending: Obstetrics and Gynecology | Admitting: Physical Therapy

## 2022-08-24 ENCOUNTER — Encounter: Payer: Self-pay | Admitting: Physical Therapy

## 2022-08-24 DIAGNOSIS — M6281 Muscle weakness (generalized): Secondary | ICD-10-CM

## 2022-08-24 DIAGNOSIS — R102 Pelvic and perineal pain unspecified side: Secondary | ICD-10-CM

## 2022-08-24 DIAGNOSIS — R278 Other lack of coordination: Secondary | ICD-10-CM | POA: Diagnosis not present
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
## 2022-08-31 ENCOUNTER — Ambulatory Visit: Payer: Medicare Other | Admitting: Orthopedic Surgery

## 2022-08-31 ENCOUNTER — Ambulatory Visit: Payer: Medicare Other | Admitting: Physician Assistant

## 2022-09-14 ENCOUNTER — Ambulatory Visit (INDEPENDENT_AMBULATORY_CARE_PROVIDER_SITE_OTHER): Payer: Medicare Other | Admitting: Physician Assistant

## 2022-09-14 ENCOUNTER — Encounter: Payer: Self-pay | Admitting: Physician Assistant

## 2022-09-14 DIAGNOSIS — M1711 Unilateral primary osteoarthritis, right knee: Secondary | ICD-10-CM

## 2022-09-14 DIAGNOSIS — M1712 Unilateral primary osteoarthritis, left knee: Secondary | ICD-10-CM | POA: Diagnosis not present

## 2022-09-14 MED ORDER — HYALURONAN 88 MG/4ML IX SOSY
88.0000 mg | PREFILLED_SYRINGE | INTRA_ARTICULAR | Status: AC | PRN
  Administered 2022-09-14: 88 mg via INTRA_ARTICULAR

## 2022-09-14 NOTE — Progress Notes (Signed)
   Procedure Note  Patient: Madison Wall             Date of Birth: 06-Oct-1940           MRN: [id number]             Visit Date: 09/14/2022   HPI: Mrs. Madison Wall comes in today for scheduled bilateral knee Monovisc injections.  She states that left knee pain is worse lateral.  She does feel that the knee gives way at times.  She takes ibuprofen which helps.  Also uses a brace.  Radiographs of her knees show no mild patellofemoral changes bilaterally.  She has no scheduled surgery for either knee in the next 6 months.  Physical exam: General well-developed well-nourished female who is able to get on and off the exam table on her own.  She uses no assistive device to ambulate. Bilateral knees full range of motion of both knees.  Patellofemoral crepitus bilaterally.  Tenderness lateral joint line of the left knee.  No gross instability valgus varus stress in either knee.  No abnormal warmth erythema or effusion of either knee.  Procedures: Visit Diagnoses:  1. Unilateral primary osteoarthritis, left knee   2. Unilateral primary osteoarthritis, right knee     Large Joint Inj: bilateral knee on 09/14/2022 11:15 AM Indications: pain Details: 22 G 1.5 in needle, anterolateral approach  Arthrogram: No  Medications (Right): 88 mg Hyaluronan 88 MG/4ML Medications (Left): 88 mg Hyaluronan 88 MG/4ML Outcome: tolerated well, no immediate complications Procedure, treatment alternatives, risks and benefits explained, specific risks discussed. Consent was given by the patient. Immediately prior to procedure a time out was called to verify the correct patient, procedure, equipment, support staff and site/side marked as required. Patient was prepped and draped in the usual sterile fashion.     Plan: She will follow-up with [place] as needed.  She knows to wait at least 6 months between supplemental injections.  Questions were encouraged and answered.

## 2022-09-28 ENCOUNTER — Ambulatory Visit: Payer: Medicare Other | Attending: Obstetrics and Gynecology | Admitting: Physical Therapy

## 2022-09-28 ENCOUNTER — Encounter: Payer: Self-pay | Admitting: Physical Therapy

## 2022-09-28 DIAGNOSIS — R102 Pelvic and perineal pain: Secondary | ICD-10-CM | POA: Diagnosis not present

## 2022-09-28 DIAGNOSIS — R278 Other lack of coordination: Secondary | ICD-10-CM | POA: Diagnosis not present

## 2022-09-28 DIAGNOSIS — M6281 Muscle weakness (generalized): Secondary | ICD-10-CM | POA: Insufficient documentation

## 2022-09-28 NOTE — Patient Instructions (Signed)
Ways to protect prolapse Quitting smoking. Treating conditions that might put strain on the pelvic floor, such as a chronic cough or constipation. Losing weight. Strengthening your core and your pelvic floor. Avoiding heavy lifting, pushing, pulling, and bending When lifting using proper body mechanics Not straining during bowel movements. High fiber diet, 30 g, to reduce chance of constipation Drink 6-8 glasses of water per day Avoid being on your feet for long periods of time Middle of the day lay on your back with your feet propped up for 5-10 minutes  Prolapse supports EVB Support briefs [facility] Restore Support Briefs Pelvic Pro Vaginal sponge  [facility] [address], [address] [place], [place] [address] Phone # [phone] Fax [phone]
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
## 2022-10-05 ENCOUNTER — Ambulatory Visit: Payer: Medicare Other | Admitting: Physical Therapy

## 2022-10-05 ENCOUNTER — Encounter: Payer: Self-pay | Admitting: Physical Therapy

## 2022-10-05 DIAGNOSIS — M6281 Muscle weakness (generalized): Secondary | ICD-10-CM

## 2022-10-05 DIAGNOSIS — R278 Other lack of coordination: Secondary | ICD-10-CM

## 2022-10-05 DIAGNOSIS — R102 Pelvic and perineal pain: Secondary | ICD-10-CM | POA: Diagnosis not present
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
## 2022-10-19 ENCOUNTER — Ambulatory Visit: Payer: Medicare Other | Admitting: Physical Therapy

## 2022-10-19 ENCOUNTER — Encounter: Payer: Self-pay | Admitting: Physical Therapy

## 2022-10-19 DIAGNOSIS — R102 Pelvic and perineal pain: Secondary | ICD-10-CM

## 2022-10-19 DIAGNOSIS — M6281 Muscle weakness (generalized): Secondary | ICD-10-CM | POA: Diagnosis not present

## 2022-10-19 DIAGNOSIS — R278 Other lack of coordination: Secondary | ICD-10-CM
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
## 2022-11-02 ENCOUNTER — Encounter: Payer: Self-pay | Admitting: Physical Therapy

## 2022-11-02 ENCOUNTER — Ambulatory Visit: Payer: Medicare Other | Attending: Obstetrics and Gynecology | Admitting: Physical Therapy

## 2022-11-02 DIAGNOSIS — M6281 Muscle weakness (generalized): Secondary | ICD-10-CM | POA: Insufficient documentation

## 2022-11-02 DIAGNOSIS — R102 Pelvic and perineal pain: Secondary | ICD-10-CM | POA: Insufficient documentation

## 2022-11-02 DIAGNOSIS — R278 Other lack of coordination: Secondary | ICD-10-CM | POA: Insufficient documentation
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
## 2022-11-09 ENCOUNTER — Ambulatory Visit: Payer: Medicare Other | Admitting: Physical Therapy

## 2022-11-09 ENCOUNTER — Encounter: Payer: Self-pay | Admitting: Physical Therapy

## 2022-11-09 DIAGNOSIS — R278 Other lack of coordination: Secondary | ICD-10-CM

## 2022-11-09 DIAGNOSIS — M6281 Muscle weakness (generalized): Secondary | ICD-10-CM | POA: Diagnosis not present

## 2022-11-09 DIAGNOSIS — R102 Pelvic and perineal pain: Secondary | ICD-10-CM | POA: Diagnosis not present
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
## 2022-11-10 ENCOUNTER — Ambulatory Visit (INDEPENDENT_AMBULATORY_CARE_PROVIDER_SITE_OTHER): Payer: Medicare Other | Admitting: Obstetrics and Gynecology

## 2022-11-10 ENCOUNTER — Encounter: Payer: Self-pay | Admitting: Obstetrics and Gynecology

## 2022-11-10 VITALS — BP 144/74 | HR 61 | Ht 59.84 in | Wt 146.0 lb

## 2022-11-10 DIAGNOSIS — R350 Frequency of micturition: Secondary | ICD-10-CM | POA: Diagnosis not present

## 2022-11-10 DIAGNOSIS — M62838 Other muscle spasm: Secondary | ICD-10-CM | POA: Diagnosis not present

## 2022-11-10 DIAGNOSIS — N816 Rectocele: Secondary | ICD-10-CM | POA: Diagnosis not present

## 2022-11-10 DIAGNOSIS — N94819 Vulvodynia, unspecified: Secondary | ICD-10-CM

## 2022-11-10 LAB — POCT URINALYSIS DIPSTICK
Bilirubin, UA: NEGATIVE
Blood, UA: NEGATIVE
Glucose, UA: NEGATIVE
Ketones, UA: NEGATIVE
Leukocytes, UA: NEGATIVE
Nitrite, UA: NEGATIVE
Protein, UA: NEGATIVE
Spec Grav, UA: 1.01 (ref 1.010–1.025)
Urobilinogen, UA: 0.2 E.U./dL
pH, UA: 6 (ref 5.0–8.0)

## 2022-11-10 MED ORDER — DIAZEPAM 5 MG PO TABS: ORAL_TABLET | ORAL | 0 refills | Status: DC

## 2022-11-10 MED ORDER — LIDOCAINE-PRILOCAINE 2.5-2.5 % EX CREA: 1.0000 | TOPICAL_CREAM | CUTANEOUS | 11 refills | Status: DC | PRN

## 2022-11-10 MED ORDER — NONFORMULARY OR COMPOUNDED ITEM: 5 refills | Status: DC
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
## 2022-11-10 NOTE — Patient Instructions (Signed)
Lidocaine cream can be placed at the vaginal opening prior to intercourse.   I will prescribe valium 5 mg pills to place vaginally up to 2 times a day for vaginal muscle spasms. Start at night and take one every night for the next several weeks to see if it improves your symptoms. Once you are improving you can taper off the medication and just use as needed. If the medication makes you drowsy then only use at bedtime and/or we can reduce the dose. Do not use gel to place it, as that will prevent the tablet from dissolving.  Instead place 1-2 drops of water on the table before inserting it in the vagina. If the pills do not dissolve well we can switch to a special compounded suppository. Let me know how you are doing on the medication and if you have any questions.  Do not use at the same time as Xanax.  For vulvodynia, I have prescribed a compounded cream with amitriptyline, baclofen and gabapentin- [facility]. They will reach out to you to send the prescription.

## 2022-12-06 DIAGNOSIS — Z23 Encounter for immunization: Secondary | ICD-10-CM | POA: Diagnosis not present

## 2022-12-14 ENCOUNTER — Telehealth: Payer: Self-pay | Admitting: Family

## 2022-12-14 ENCOUNTER — Other Ambulatory Visit: Payer: Self-pay | Admitting: Family

## 2022-12-14 NOTE — Telephone Encounter (Signed)
She got a prescription for Valium from her urogyn. We cannot mix this with her Alprazolam. I need to know her preference.

## 2022-12-14 NOTE — Telephone Encounter (Signed)
Requesting: alprazolam 0.5mg   Contract: None UDS: None Last Visit:08/11/22 Next Visit: 08/12/23 Last Refill: 08/13/22 #90 and 0RF   Please Advise

## 2022-12-14 NOTE — Telephone Encounter (Signed)
Spoke w/ Pt- she was given the diazepam to try from Dr. Florian Buff for vaginal spasms- she used it once or twice but didn't like how it made her feel, she does not plan to use it again and is aware she can not take alprazolam and diazepam together. She plans to follow-up with Dr. Florian Buff for further recommendations on pelvic spasms.

## 2023-01-20 ENCOUNTER — Ambulatory Visit: Payer: Medicare Other | Admitting: Obstetrics and Gynecology

## 2023-01-27 ENCOUNTER — Ambulatory Visit: Payer: Medicare Other | Admitting: Obstetrics and Gynecology

## 2023-03-03 ENCOUNTER — Ambulatory Visit: Payer: Medicare Other | Admitting: Obstetrics and Gynecology

## 2023-03-18 DIAGNOSIS — L570 Actinic keratosis: Secondary | ICD-10-CM | POA: Diagnosis not present

## 2023-03-18 DIAGNOSIS — Z85828 Personal history of other malignant neoplasm of skin: Secondary | ICD-10-CM | POA: Diagnosis not present

## 2023-03-18 DIAGNOSIS — L821 Other seborrheic keratosis: Secondary | ICD-10-CM | POA: Diagnosis not present

## 2023-03-31 ENCOUNTER — Ambulatory Visit: Payer: Medicare Other | Admitting: Obstetrics and Gynecology

## 2023-04-05 ENCOUNTER — Other Ambulatory Visit: Payer: Self-pay | Admitting: Family

## 2023-04-05 NOTE — Telephone Encounter (Signed)
 Requesting: alprazolam 0.5mg   Contract: None UDS: None Last Visit: 08/11/22 Next Visit: 08/12/23 Last Refill: 12/15/22 #90 and 0RF   Please Advise

## 2023-04-08 DIAGNOSIS — Z85828 Personal history of other malignant neoplasm of skin: Secondary | ICD-10-CM | POA: Diagnosis not present

## 2023-04-08 DIAGNOSIS — L821 Other seborrheic keratosis: Secondary | ICD-10-CM | POA: Diagnosis not present

## 2023-04-08 DIAGNOSIS — D225 Melanocytic nevi of trunk: Secondary | ICD-10-CM | POA: Diagnosis not present

## 2023-04-08 DIAGNOSIS — L820 Inflamed seborrheic keratosis: Secondary | ICD-10-CM | POA: Diagnosis not present

## 2023-04-13 DIAGNOSIS — H04123 Dry eye syndrome of bilateral lacrimal glands: Secondary | ICD-10-CM | POA: Diagnosis not present

## 2023-04-13 DIAGNOSIS — H524 Presbyopia: Secondary | ICD-10-CM | POA: Diagnosis not present

## 2023-04-13 DIAGNOSIS — Z961 Presence of intraocular lens: Secondary | ICD-10-CM | POA: Diagnosis not present

## 2023-07-08 ENCOUNTER — Other Ambulatory Visit: Payer: Self-pay | Admitting: Family

## 2023-07-08 NOTE — Telephone Encounter (Signed)
 Requesting: alprazolam 0.5mg   Contract:  None UDS: None Last Visit: 08/11/22 Next Visit: 08/17/23 Last Refill: 04/05/23 #90 and 0RF  Please Advise

## 2023-08-12 ENCOUNTER — Ambulatory Visit: Payer: Medicare Other | Admitting: Family

## 2023-08-17 ENCOUNTER — Encounter: Payer: Self-pay | Admitting: Family

## 2023-08-17 ENCOUNTER — Ambulatory Visit (INDEPENDENT_AMBULATORY_CARE_PROVIDER_SITE_OTHER): Admitting: Family

## 2023-08-17 VITALS — BP 140/90 | HR 86 | Temp 98.3°F | Ht 59.0 in | Wt 141.0 lb

## 2023-08-17 DIAGNOSIS — J0140 Acute pansinusitis, unspecified: Secondary | ICD-10-CM | POA: Diagnosis not present

## 2023-08-17 DIAGNOSIS — I10 Essential (primary) hypertension: Secondary | ICD-10-CM | POA: Diagnosis not present

## 2023-08-17 MED ORDER — AMOXICILLIN-POT CLAVULANATE 875-125 MG PO TABS: 1.0000 | ORAL_TABLET | Freq: Two times a day (BID) | ORAL | 0 refills | Status: AC

## 2023-08-17 MED ORDER — AMLODIPINE BESYLATE 2.5 MG PO TABS: 2.5000 mg | ORAL_TABLET | Freq: Every day | ORAL | 3 refills | Status: AC

## 2023-08-17 MED ORDER — PROMETHAZINE-DM 6.25-15 MG/5ML PO SYRP: 5.0000 mL | ORAL_SOLUTION | Freq: Four times a day (QID) | ORAL | 0 refills | Status: DC | PRN
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
## 2023-08-23 ENCOUNTER — Encounter: Payer: Self-pay | Admitting: Family

## 2023-08-23 ENCOUNTER — Other Ambulatory Visit: Payer: Self-pay | Admitting: Family

## 2023-08-23 MED ORDER — PREDNISONE 20 MG PO TABS: 20.0000 mg | ORAL_TABLET | Freq: Every day | ORAL | 0 refills | Status: DC

## 2023-09-17 ENCOUNTER — Ambulatory Visit (INDEPENDENT_AMBULATORY_CARE_PROVIDER_SITE_OTHER): Admitting: Family

## 2023-09-17 VITALS — BP 138/82 | HR 71 | Ht 59.0 in | Wt 140.0 lb

## 2023-09-17 DIAGNOSIS — Z13220 Encounter for screening for lipoid disorders: Secondary | ICD-10-CM | POA: Diagnosis not present

## 2023-09-17 DIAGNOSIS — R051 Acute cough: Secondary | ICD-10-CM

## 2023-09-17 DIAGNOSIS — R7309 Other abnormal glucose: Secondary | ICD-10-CM | POA: Diagnosis not present

## 2023-09-17 DIAGNOSIS — E559 Vitamin D deficiency, unspecified: Secondary | ICD-10-CM

## 2023-09-17 DIAGNOSIS — I10 Essential (primary) hypertension: Secondary | ICD-10-CM | POA: Diagnosis not present

## 2023-09-17 DIAGNOSIS — R0681 Apnea, not elsewhere classified: Secondary | ICD-10-CM | POA: Diagnosis not present

## 2023-09-17 LAB — COMPREHENSIVE METABOLIC PANEL WITH GFR
ALT: 34 U/L (ref 0–35)
AST: 26 U/L (ref 0–37)
Albumin: 4 g/dL (ref 3.5–5.2)
Alkaline Phosphatase: 70 U/L (ref 39–117)
BUN: 13 mg/dL (ref 6–23)
CO2: 30 meq/L (ref 19–32)
Calcium: 9.4 mg/dL (ref 8.4–10.5)
Chloride: 104 meq/L (ref 96–112)
Creatinine, Ser: 0.48 mg/dL (ref 0.40–1.20)
GFR: 88.53 mL/min (ref >60.00)
Glucose, Bld: 89 mg/dL (ref 70–99)
Potassium: 3.6 meq/L (ref 3.5–5.1)
Sodium: 140 meq/L (ref 135–145)
Total Bilirubin: 0.4 mg/dL (ref 0.2–1.2)
Total Protein: 6.5 g/dL (ref 6.0–8.3)

## 2023-09-17 LAB — CBC WITH DIFFERENTIAL/PLATELET
Basophils Absolute: 0 10*3/uL (ref 0.0–0.1)
Basophils Relative: 1.3 % (ref 0.0–3.0)
Eosinophils Absolute: 0.5 10*3/uL (ref 0.0–0.7)
Eosinophils Relative: 12.2 % — ABNORMAL HIGH (ref 0.0–5.0)
HCT: 39.7 % (ref 36.0–46.0)
Hemoglobin: 13 g/dL (ref 12.0–15.0)
Lymphocytes Relative: 33.8 % (ref 12.0–46.0)
Lymphs Abs: 1.3 10*3/uL (ref 0.7–4.0)
MCHC: 32.7 g/dL (ref 30.0–36.0)
MCV: 89.3 fl (ref 78.0–100.0)
Monocytes Absolute: 0.4 10*3/uL (ref 0.1–1.0)
Monocytes Relative: 9.7 % (ref 3.0–12.0)
Neutro Abs: 1.7 10*3/uL (ref 1.4–7.7)
Neutrophils Relative %: 43 % (ref 43.0–77.0)
Platelets: 192 10*3/uL (ref 150.0–400.0)
RBC: 4.44 Mil/uL (ref 3.87–5.11)
RDW: 14 % (ref 11.5–15.5)
WBC: 3.9 10*3/uL — ABNORMAL LOW (ref 4.0–10.5)

## 2023-09-17 LAB — LIPID PANEL
Cholesterol: 170 mg/dL (ref 0–200)
HDL: 69.2 mg/dL (ref >39.00)
LDL Cholesterol: 82 mg/dL (ref 0–99)
NonHDL: 101.13
Total CHOL/HDL Ratio: 2
Triglycerides: 96 mg/dL (ref 0.0–149.0)
VLDL: 19.2 mg/dL (ref 0.0–40.0)

## 2023-09-17 LAB — HEMOGLOBIN A1C: Hgb A1c MFr Bld: 6.3 % (ref 4.6–6.5)

## 2023-09-17 LAB — VITAMIN D 25 HYDROXY (VIT D DEFICIENCY, FRACTURES): VITD: 67.34 ng/mL (ref 30.00–100.00)

## 2023-09-17 MED ORDER — NYSTATIN-TRIAMCINOLONE 100000-0.1 UNIT/GM-% EX OINT: 1.0000 | TOPICAL_OINTMENT | Freq: Two times a day (BID) | CUTANEOUS | 6 refills | Status: AC

## 2023-09-17 MED ORDER — ALPRAZOLAM 0.5 MG PO TABS: 0.5000 mg | ORAL_TABLET | Freq: Every evening | ORAL | 0 refills | Status: DC | PRN

## 2023-09-17 MED ORDER — ALBUTEROL SULFATE HFA 108 (90 BASE) MCG/ACT IN AERS: INHALATION_SPRAY | RESPIRATORY_TRACT | 3 refills | Status: AC

## 2023-09-17 MED ORDER — HYDROCHLOROTHIAZIDE 12.5 MG PO CAPS: ORAL_CAPSULE | ORAL | 3 refills | Status: AC
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
## 2023-09-20 ENCOUNTER — Ambulatory Visit: Payer: Self-pay | Admitting: Family

## 2023-09-23 ENCOUNTER — Other Ambulatory Visit: Payer: Self-pay | Admitting: Family

## 2023-09-23 DIAGNOSIS — R7303 Prediabetes: Secondary | ICD-10-CM

## 2023-11-02 ENCOUNTER — Other Ambulatory Visit: Payer: Self-pay | Admitting: Family

## 2023-11-15 ENCOUNTER — Telehealth: Payer: Self-pay | Admitting: Family

## 2023-11-15 NOTE — Telephone Encounter (Unsigned)
 Copied from CRM #[id number]. Topic: Appointments - Transfer of Care >> Nov 15, 2023  1:45 PM Gennette ORN wrote: Pt is requesting to transfer FROM: Dr.Murray Pt is requesting to transfer TO: Dr.Beck Reason for requested transfer: she likes [facility] It is the responsibility of the team the patient would like to transfer to (Dr. Almarie) to reach out to the patient if for any reason this transfer is not acceptable.

## 2023-12-08 ENCOUNTER — Institutional Professional Consult (permissible substitution): Admitting: Neurology

## 2023-12-22 DIAGNOSIS — Z23 Encounter for immunization: Secondary | ICD-10-CM | POA: Diagnosis not present

## 2023-12-28 ENCOUNTER — Encounter: Payer: Self-pay | Admitting: Family Medicine

## 2023-12-28 ENCOUNTER — Ambulatory Visit (INDEPENDENT_AMBULATORY_CARE_PROVIDER_SITE_OTHER): Admitting: Family Medicine

## 2023-12-28 VITALS — BP 142/72 | HR 64 | Ht 59.0 in | Wt 138.0 lb

## 2023-12-28 DIAGNOSIS — N94819 Vulvodynia, unspecified: Secondary | ICD-10-CM

## 2023-12-28 DIAGNOSIS — I10 Essential (primary) hypertension: Secondary | ICD-10-CM | POA: Diagnosis not present

## 2023-12-28 DIAGNOSIS — Z0000 Encounter for general adult medical examination without abnormal findings: Secondary | ICD-10-CM

## 2023-12-28 DIAGNOSIS — F418 Other specified anxiety disorders: Secondary | ICD-10-CM | POA: Diagnosis not present

## 2023-12-28 DIAGNOSIS — Z79899 Other long term (current) drug therapy: Secondary | ICD-10-CM | POA: Diagnosis not present

## 2023-12-28 DIAGNOSIS — R7303 Prediabetes: Secondary | ICD-10-CM | POA: Diagnosis not present

## 2023-12-28 DIAGNOSIS — J309 Allergic rhinitis, unspecified: Secondary | ICD-10-CM | POA: Diagnosis not present

## 2023-12-28 NOTE — Assessment & Plan Note (Signed)
 Chronic vulvodynia with episodic pain. Following with gynecology.

## 2023-12-28 NOTE — Progress Notes (Signed)
 Established Patient Office Visit  Subjective   Patient ID: Madison Wall, female    DOB: September 15, 1940  Age: 83 y.o. MRN: [id number]  Chief Complaint  Patient presents with   Establish Care    HPI  Discussed the use of AI scribe software for clinical note transcription with the patient, who gave verbal consent to proceed.  History of Present Illness Madison Wall is an 83 year old female who presents for medication management and ear wax evaluation.  Her hypertension is managed with amlodipine  2.5 mg twice daily, and she uses hydrochlorothiazide  as needed if her blood pressure exceeds 150/90 mmHg. Her blood pressure tends to rise with anxiety or stress but is generally well-controlled.  She has a history of anxiety and uses Xanax  at bedtime for sleep and muscle relaxation, which is effective. She is aware of safety concerns.   She has vulvodynia, managed with physical therapy and medications - following with GYN (Dr. Lequita). She uses ointment and ice for relief, along with Xanax  during episodes of severe discomfort.  She has allergies to mold, dust, and trees, and uses albuterol  as needed for respiratory symptoms.  She is currently undergoing dental work, which has delayed a planned sleep study. She received her COVID vaccine last week and is scheduled for a flu shot soon.           ROS All review of systems negative except what is listed in the HPI    Objective:     BP (!) 142/72   Pulse 64   Ht 4' 11 (1.499 m)   Wt 138 lb (62.6 kg)   SpO2 100%   BMI 27.87 kg/m    Physical Exam Vitals reviewed.  Constitutional:      Appearance: Normal appearance.  HENT:     Right Ear: There is no impacted cerumen.     Left Ear: There is no impacted cerumen.  Cardiovascular:     Rate and Rhythm: Normal rate and regular rhythm.     Heart sounds: Normal heart sounds.  Pulmonary:     Effort: Pulmonary effort is normal.     Breath sounds: Normal breath  sounds.  Skin:    General: Skin is warm and dry.  Neurological:     Mental Status: She is alert and oriented to person, place, and time.  Psychiatric:        Mood and Affect: Mood normal.        Behavior: Behavior normal.        Thought Content: Thought content normal.        Judgment: Judgment normal.         No results found for any visits on 12/28/23.    The ASCVD Risk score (Arnett DK, et al., 2019) failed to calculate for the following reasons:   The 2019 ASCVD risk score is only valid for ages 16 to 60    Assessment & Plan:   Problem List Items Addressed This Visit       Active Problems   Prediabetes   Impaired fasting glucose with a previous reading of 6.3 and a family history of diabetes. - Monitor blood glucose levels regularly. - Follow up with lab work in January.      Mixed anxiety and depressive disorder   Chronic insomnia and anxiety managed with Xanax  at bedtime. Controlled substance contract and annual drug screening are necessary. - Continue Xanax  at bedtime  PRN for insomnia and anxiety. Safety discussed. - Complete controlled substance  contract. - Perform annual drug screening.      Relevant Orders   Drug Monitoring Panel [id number] , Urine   Primary hypertension - Primary   Blood pressure generally well-controlled with amlodipine  2.5 mg twice daily. Today's reading was 142/72, slightly elevated, with increases noted during anxiety and stress. - Continue amlodipine  2.5 mg twice daily. - Use hydrochlorothiazide  as needed if blood pressure exceeds 150/90.      Vulvodynia   Chronic vulvodynia with episodic pain. Following with gynecology.      Allergic rhinitis   Well controlled on current regimen.       Other Visit Diagnoses       Encounter for medical examination to establish care            Return in about 3 months (around 03/29/2024) for chronic disease management, Xanax , labs.    [facility] KATHEE Mon, NP

## 2023-12-28 NOTE — Assessment & Plan Note (Signed)
 Well-controlled on current regimen. ?

## 2023-12-28 NOTE — Assessment & Plan Note (Signed)
 Blood pressure generally well-controlled with amlodipine  2.5 mg twice daily. Today's reading was 142/72, slightly elevated, with increases noted during anxiety and stress. - Continue amlodipine  2.5 mg twice daily. - Use hydrochlorothiazide  as needed if blood pressure exceeds 150/90.

## 2023-12-28 NOTE — Assessment & Plan Note (Signed)
 Impaired fasting glucose with a previous reading of 6.3 and a family history of diabetes. - Monitor blood glucose levels regularly. - Follow up with lab work in January.

## 2023-12-28 NOTE — Assessment & Plan Note (Signed)
 Chronic insomnia and anxiety managed with Xanax  at bedtime. Controlled substance contract and annual drug screening are necessary. - Continue Xanax  at bedtime  PRN for insomnia and anxiety. Safety discussed. - Complete controlled substance contract. - Perform annual drug screening.

## 2023-12-29 ENCOUNTER — Encounter: Payer: Self-pay | Admitting: Family Medicine

## 2023-12-30 LAB — DM TEMPLATE

## 2023-12-31 ENCOUNTER — Ambulatory Visit: Payer: Self-pay | Admitting: Family Medicine

## 2023-12-31 DIAGNOSIS — Z23 Encounter for immunization: Secondary | ICD-10-CM | POA: Diagnosis not present

## 2023-12-31 LAB — DRUG MONITORING PANEL 376104, URINE
Alphahydroxyalprazolam: 47 ng/mL — ABNORMAL HIGH (ref <25)
Alphahydroxymidazolam: NEGATIVE ng/mL (ref <50)
Alphahydroxytriazolam: NEGATIVE ng/mL (ref <50)
Aminoclonazepam: NEGATIVE ng/mL (ref <25)
Benzodiazepines: POSITIVE ng/mL — AB (ref <100)
Cocaine Metabolite: NEGATIVE ng/mL (ref <150)
Desmethyltramadol: NEGATIVE ng/mL (ref <100)
Hydroxyethylflurazepam: NEGATIVE ng/mL (ref <50)
Lorazepam: NEGATIVE ng/mL (ref <50)
Nordiazepam: NEGATIVE ng/mL (ref <50)
Opiates: NEGATIVE ng/mL (ref <100)
Oxazepam: NEGATIVE ng/mL (ref <50)
Oxycodone: NEGATIVE ng/mL (ref <100)
Temazepam: NEGATIVE ng/mL (ref <50)
Tramadol: NEGATIVE ng/mL (ref <100)
Tramadol: NEGATIVE ng/mL (ref <500)

## 2023-12-31 LAB — DM TEMPLATE

## 2024-01-05 DIAGNOSIS — Z6826 Body mass index (BMI) 26.0-26.9, adult: Secondary | ICD-10-CM | POA: Diagnosis not present

## 2024-01-05 DIAGNOSIS — Z01419 Encounter for gynecological examination (general) (routine) without abnormal findings: Secondary | ICD-10-CM | POA: Diagnosis not present

## 2024-01-05 DIAGNOSIS — N951 Menopausal and female climacteric states: Secondary | ICD-10-CM | POA: Diagnosis not present

## 2024-01-10 ENCOUNTER — Other Ambulatory Visit: Payer: Self-pay | Admitting: Neurology

## 2024-01-10 MED ORDER — ALPRAZOLAM 0.5 MG PO TABS: 0.5000 mg | ORAL_TABLET | Freq: Every evening | ORAL | 0 refills | Status: DC | PRN

## 2024-01-10 NOTE — Telephone Encounter (Signed)
 Received request from Arlo[facility] Prior for Xanax  refill. Last filled in July.

## 2024-02-07 ENCOUNTER — Other Ambulatory Visit: Payer: Self-pay | Admitting: Family Medicine

## 2024-02-07 NOTE — Telephone Encounter (Signed)
 Requesting: Xanax  Contract: 12/28/2023 UDS: 12/28/2023 Last Visit: 12/28/2023 Next Visit: 03/31/2024 Last Refill: 01/10/2024 #30 with no refills  Please Advise

## 2024-02-09 ENCOUNTER — Encounter: Payer: Self-pay | Admitting: Family Medicine

## 2024-02-09 NOTE — Telephone Encounter (Signed)
 FYI

## 2024-03-10 ENCOUNTER — Other Ambulatory Visit: Payer: Self-pay | Admitting: Family Medicine

## 2024-03-10 NOTE — Telephone Encounter (Signed)
 Requesting: Xanax  0.5 mg Contract: 12/28/2023 UDS: 12/28/2023 Last Visit: 12/28/2023 Next Visit: 03/31/2024 Last Refill: 02/09/2024 #30 with no refills  Please Advise

## 2024-03-29 ENCOUNTER — Other Ambulatory Visit

## 2024-03-29 ENCOUNTER — Ambulatory Visit: Admitting: Family Medicine

## 2024-03-30 ENCOUNTER — Encounter: Payer: Self-pay | Admitting: Family Medicine

## 2024-03-30 ENCOUNTER — Telehealth: Payer: Self-pay | Admitting: Family Medicine

## 2024-03-30 NOTE — Telephone Encounter (Signed)
 Copied from CRM #[id number]. Topic: General - Other >> Mar 30, 2024  8:19 AM Deleta RAMAN wrote: Reason for CRM: Patient is requesting to leave practice for another facility. Please contact [phone] to drop care

## 2024-03-30 NOTE — Telephone Encounter (Signed)
 Patient sent message and [facility] has already been removed as PCP.

## 2024-03-30 NOTE — Telephone Encounter (Signed)
Taylor - FYI 

## 2024-03-31 ENCOUNTER — Ambulatory Visit: Admitting: Family Medicine

## 2024-03-31 ENCOUNTER — Other Ambulatory Visit

## 2024-04-07 ENCOUNTER — Other Ambulatory Visit: Payer: Self-pay | Admitting: Family Medicine

## 2024-04-08 ENCOUNTER — Other Ambulatory Visit: Payer: Self-pay | Admitting: Family Medicine

## 2024-05-10 ENCOUNTER — Ambulatory Visit: Admitting: Family Medicine

## 2024-09-29 ENCOUNTER — Ambulatory Visit: Admitting: Family
